# Patient Record
Sex: Female | Born: 1985 | Race: White | Hispanic: No | Marital: Married | Smoking: Former smoker
Health system: Southern US, Community
[De-identification: ages and names within clinical notes are randomized; demographics above are authoritative.]

## PROBLEM LIST (undated history)

## (undated) DIAGNOSIS — M26629 Arthralgia of temporomandibular joint, unspecified side: Secondary | ICD-10-CM

## (undated) DIAGNOSIS — F419 Anxiety disorder, unspecified: Secondary | ICD-10-CM

## (undated) DIAGNOSIS — J189 Pneumonia, unspecified organism: Secondary | ICD-10-CM

## (undated) DIAGNOSIS — J45909 Unspecified asthma, uncomplicated: Secondary | ICD-10-CM

## (undated) DIAGNOSIS — M549 Dorsalgia, unspecified: Secondary | ICD-10-CM

## (undated) DIAGNOSIS — Z8742 Personal history of other diseases of the female genital tract: Secondary | ICD-10-CM

## (undated) DIAGNOSIS — G43909 Migraine, unspecified, not intractable, without status migrainosus: Secondary | ICD-10-CM

## (undated) DIAGNOSIS — K219 Gastro-esophageal reflux disease without esophagitis: Secondary | ICD-10-CM

## (undated) HISTORY — PX: COLONOSCOPY: SHX174

## (undated) HISTORY — PX: WISDOM TOOTH EXTRACTION: SHX21

## (undated) HISTORY — DX: Migraine, unspecified, not intractable, without status migrainosus: G43.909

## (undated) HISTORY — DX: Personal history of other diseases of the female genital tract: Z87.42

## (undated) HISTORY — DX: Dorsalgia, unspecified: M54.9

## (undated) HISTORY — DX: Arthralgia of temporomandibular joint, unspecified side: M26.629

## (undated) HISTORY — DX: Anxiety disorder, unspecified: F41.9

---

## 2013-01-16 ENCOUNTER — Encounter: Payer: Self-pay | Admitting: Family

## 2013-01-16 ENCOUNTER — Ambulatory Visit (INDEPENDENT_AMBULATORY_CARE_PROVIDER_SITE_OTHER): Payer: BC Managed Care – PPO | Admitting: Family

## 2013-01-16 VITALS — BP 110/80 | HR 96 | Temp 98.5°F | Resp 16 | Ht 60.5 in | Wt 138.1 lb

## 2013-01-16 DIAGNOSIS — G43909 Migraine, unspecified, not intractable, without status migrainosus: Secondary | ICD-10-CM

## 2013-01-16 DIAGNOSIS — M542 Cervicalgia: Secondary | ICD-10-CM

## 2013-01-16 DIAGNOSIS — F341 Dysthymic disorder: Secondary | ICD-10-CM

## 2013-01-16 DIAGNOSIS — G8929 Other chronic pain: Secondary | ICD-10-CM

## 2013-01-16 DIAGNOSIS — F419 Anxiety disorder, unspecified: Secondary | ICD-10-CM

## 2013-01-16 DIAGNOSIS — F411 Generalized anxiety disorder: Secondary | ICD-10-CM

## 2013-01-16 DIAGNOSIS — M549 Dorsalgia, unspecified: Secondary | ICD-10-CM

## 2013-01-16 DIAGNOSIS — Z309 Encounter for contraceptive management, unspecified: Secondary | ICD-10-CM

## 2013-01-16 NOTE — Patient Instructions (Addendum)
Please complete your x rays on the first floor. You will be contacted about your referral to the psychiatrist and the therapist. Welcome to [facility]!
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
## 2013-01-18 ENCOUNTER — Encounter: Payer: Self-pay | Admitting: Family

## 2013-01-18 ENCOUNTER — Ambulatory Visit (HOSPITAL_BASED_OUTPATIENT_CLINIC_OR_DEPARTMENT_OTHER)
Admission: RE | Admit: 2013-01-18 | Discharge: 2013-01-18 | Disposition: A | Discharge status: Home / Self Care | Payer: BC Managed Care – PPO | Source: Ambulatory Visit | Attending: Family | Admitting: Family

## 2013-01-18 DIAGNOSIS — M549 Dorsalgia, unspecified: Secondary | ICD-10-CM | POA: Insufficient documentation

## 2013-01-18 DIAGNOSIS — M542 Cervicalgia: Secondary | ICD-10-CM | POA: Insufficient documentation

## 2013-01-18 DIAGNOSIS — G8929 Other chronic pain: Secondary | ICD-10-CM

## 2013-01-19 ENCOUNTER — Encounter: Payer: Self-pay | Admitting: Family

## 2013-01-19 ENCOUNTER — Telehealth: Payer: Self-pay | Admitting: Family

## 2013-01-19 ENCOUNTER — Telehealth: Payer: Self-pay | Admitting: *Deleted

## 2013-01-19 DIAGNOSIS — G894 Chronic pain syndrome: Secondary | ICD-10-CM

## 2013-01-19 NOTE — Telephone Encounter (Signed)
Received medical records from [facility]. Oneida Arenas: [phone] F: [phone]

## 2013-01-19 NOTE — Telephone Encounter (Signed)
Lindsey Petty, could you pls fax referral to Dr. Laury Deep (psychiatry) at [facility]?  Thanks.

## 2013-01-19 NOTE — Telephone Encounter (Signed)
Opened in error

## 2013-01-19 NOTE — Telephone Encounter (Signed)
Message copied by Kathi Simpers on Fri Jan 19, 2013 10:51 AM ------      Message from: O'SULLIVAN, MELISSA      Created: Thu Jan 18, 2013 10:20 PM       Pls call pt and let her know that the x rays of her neck, upper and lower back are normal.  I would recommend PT to help with pain if she is willing. ------

## 2013-01-19 NOTE — Telephone Encounter (Signed)
Notified pt. She states that she is not able to afford physical therapy. Also states that she does not want to see a chiropractor as she has in the past and it only aggravated her pain. Pt wants to know what her next steps will be? Pt states we may notify her via mychart.

## 2013-01-21 DIAGNOSIS — F329 Major depressive disorder, single episode, unspecified: Secondary | ICD-10-CM | POA: Insufficient documentation

## 2013-01-21 DIAGNOSIS — Z309 Encounter for contraceptive management, unspecified: Secondary | ICD-10-CM | POA: Insufficient documentation

## 2013-01-21 DIAGNOSIS — M549 Dorsalgia, unspecified: Secondary | ICD-10-CM | POA: Insufficient documentation

## 2013-01-21 DIAGNOSIS — M542 Cervicalgia: Secondary | ICD-10-CM | POA: Insufficient documentation

## 2013-01-21 DIAGNOSIS — G43909 Migraine, unspecified, not intractable, without status migrainosus: Secondary | ICD-10-CM | POA: Insufficient documentation

## 2013-01-21 NOTE — Assessment & Plan Note (Signed)
Consider referral to another neurologist next visit if pt is agreeable.

## 2013-01-21 NOTE — Assessment & Plan Note (Signed)
During visit pt requested "somthing stronger for chronic pain." I advised her that I do not treat chronic pain and recommended a referral to pain management after we further evaluate etiology of her pain.  X ray of thoracic/lumbar spines unremarkable.  Likely musculoskeletal.  Recommended PT she declines due to cost.  Alternatively recommended referral to pain management or sports medicine.  See my chart message/phone notes.

## 2013-01-21 NOTE — Assessment & Plan Note (Addendum)
Depression appears well controlled on Wellbutrin, however, anxiety is suboptimally controlled.  I am concerned about her "memory loss" during childhood and possibly some underlying PTSD. I have advised her to be evaluated by psychiatry and psychology.

## 2013-01-21 NOTE — Assessment & Plan Note (Signed)
She does not like side effects of depo.  I advised her that we could consider OCP when her depo would be due.

## 2013-01-22 ENCOUNTER — Telehealth: Payer: Self-pay | Admitting: Family

## 2013-01-22 NOTE — Telephone Encounter (Signed)
Message copied by Sandford Craze on Mon Jan 22, 2013  5:07 PM ------      Message from: Luanne Bras      Created: Mon Jan 22, 2013  1:03 PM      Regarding: Your message may not be read      Contact: [phone]          ----- Delivery failure of internet email alert          ----- Failed to log attempted delivery of internet email alert       Tickler type: Message      Message Id(WMG): [id number]      SMTP Response: 410      Patient: Lindsey Petty,Lindsey Petty([id number])      Recipient: ()      Internet alert email: [email or web address]               ----- Original WMG message to the patient -----      Sent: 01/22/2013 12:59 PM      From: Sandford Craze, NP      To: [facility]      Message Type: Patient Medical Advice Request      Subject: RE: Visit Follow-Up Question      I have placed referral.  Myriam Jacobson will contact you when appointment is arranged.            Shakima Nisley            ----- Message -----         FromSharin Grave         Sent: 01/19/2013  8:36 PM EDT           To: Lemont Fillers., NP      Subject: Visit Follow-Up Question            If you could do a referral to the pain management doctor that would probably be the best because I have tried to get back into my workout routine but I can't make it too long without having to stop because it hurts too much. I'll try to set the appointment after the psychiatrist to see if the pain is being caused by the anxiety and/or depression. ------

## 2013-01-23 ENCOUNTER — Ambulatory Visit (INDEPENDENT_AMBULATORY_CARE_PROVIDER_SITE_OTHER): Payer: BC Managed Care – PPO | Admitting: Psychiatry

## 2013-01-23 ENCOUNTER — Encounter (HOSPITAL_COMMUNITY): Payer: Self-pay | Admitting: Psychiatry

## 2013-01-23 ENCOUNTER — Telehealth: Payer: Self-pay | Admitting: Family

## 2013-01-23 VITALS — BP 118/82 | HR 122 | Ht 60.0 in | Wt 142.0 lb

## 2013-01-23 DIAGNOSIS — F429 Obsessive-compulsive disorder, unspecified: Secondary | ICD-10-CM

## 2013-01-23 DIAGNOSIS — F4310 Post-traumatic stress disorder, unspecified: Secondary | ICD-10-CM

## 2013-01-23 DIAGNOSIS — F411 Generalized anxiety disorder: Secondary | ICD-10-CM

## 2013-01-23 MED ORDER — FLUOXETINE HCL 10 MG PO CAPS: 10.0000 mg | ORAL_CAPSULE | Freq: Every day | ORAL | Status: DC

## 2013-01-23 NOTE — Telephone Encounter (Signed)
Received medical records from [facility] in [place], [place]  [place]: [phone] F: [phone]
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
## 2013-01-28 ENCOUNTER — Encounter: Payer: Self-pay | Admitting: Family

## 2013-01-29 ENCOUNTER — Telehealth (HOSPITAL_COMMUNITY): Payer: Self-pay

## 2013-01-29 NOTE — Telephone Encounter (Signed)
Called patient's husband twice. No answer.  Called patient twice, advised her to have vaginal bleeding evaluated by her PCP or Obgyn.

## 2013-01-29 NOTE — Telephone Encounter (Signed)
Lindsey Petty, pt is requesting appointment status with Pain management due to worsening pain.

## 2013-01-29 NOTE — Telephone Encounter (Signed)
Called patient - Left message

## 2013-01-30 ENCOUNTER — Telehealth (HOSPITAL_COMMUNITY): Payer: Self-pay

## 2013-01-30 NOTE — Telephone Encounter (Addendum)
PT RETURNED CALL 2 TIMES LAST NIGHT  LM WITH PATIENT @8 :30 AM 01/31/13 TO COME TO APPT AT 11AM TODAY.

## 2013-01-30 NOTE — Telephone Encounter (Addendum)
The patient reports that she had some extra bleeding and she talked to her gynecologist and she has been in better mood today and has not been taking prozac.  She has gone down to clonazepam but does not feel any withdrawal symptoms.  Will reschedule patient. If possible early tomorrow.

## 2013-01-31 ENCOUNTER — Encounter (HOSPITAL_COMMUNITY): Payer: Self-pay | Admitting: Psychiatry

## 2013-01-31 ENCOUNTER — Ambulatory Visit (INDEPENDENT_AMBULATORY_CARE_PROVIDER_SITE_OTHER): Payer: BC Managed Care – PPO | Admitting: Psychiatry

## 2013-01-31 ENCOUNTER — Encounter: Payer: Self-pay | Admitting: Family

## 2013-01-31 VITALS — BP 108/70 | HR 108 | Ht 60.0 in | Wt 138.0 lb

## 2013-01-31 DIAGNOSIS — F411 Generalized anxiety disorder: Secondary | ICD-10-CM

## 2013-01-31 MED ORDER — BUPROPION HCL ER (XL) 150 MG PO TB24: ORAL_TABLET | ORAL | Status: DC
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
## 2013-02-05 ENCOUNTER — Telehealth (HOSPITAL_COMMUNITY): Payer: Self-pay

## 2013-02-05 ENCOUNTER — Telehealth: Payer: Self-pay | Admitting: Family

## 2013-02-05 NOTE — Telephone Encounter (Signed)
WANTS TO KNOW WHAT TESTS SHE IS BEING REFERRED FOR [facility] AND SAYS DOING WELL ON MEDICATION

## 2013-02-05 NOTE — Telephone Encounter (Signed)
Lindsey Petty, could you please submit her pain clinic referral to a different pain clinic? Dr. Emelda Fear office declined to take her. thanks

## 2013-02-06 NOTE — Telephone Encounter (Signed)
Referral to [facility]

## 2013-02-06 NOTE — Telephone Encounter (Signed)
Called patient and informed her about test.  She wants [facility].

## 2013-02-28 ENCOUNTER — Encounter: Payer: Self-pay | Admitting: Family

## 2013-03-01 ENCOUNTER — Ambulatory Visit (HOSPITAL_COMMUNITY): Payer: Self-pay | Admitting: Psychiatry

## 2013-03-09 ENCOUNTER — Telehealth: Payer: Self-pay | Admitting: Family

## 2013-03-09 NOTE — Telephone Encounter (Signed)
Notified pt of Provider instructions. Pt states that she is finding it difficult to afford follow up with Psychiatry as they are still running tests. Advised pt that she needs to contact psychiatry for refills. Pt states that she probably will not go back to them and disconnected the call.

## 2013-03-09 NOTE — Telephone Encounter (Signed)
These medications should be filled by psychiatry- Dr. Jacqulyn Cane, MD

## 2013-03-09 NOTE — Telephone Encounter (Signed)
Please advise 

## 2013-03-09 NOTE — Telephone Encounter (Signed)
Patient called in requesting refills of wellbutrin and klonopin

## 2013-03-12 ENCOUNTER — Encounter: Payer: Self-pay | Admitting: Family

## 2013-03-13 ENCOUNTER — Encounter: Payer: Self-pay | Admitting: Family

## 2013-03-13 MED ORDER — ALPRAZOLAM 0.5 MG PO TABS: ORAL_TABLET | ORAL | Status: DC

## 2013-03-15 ENCOUNTER — Telehealth: Payer: Self-pay | Admitting: *Deleted

## 2013-03-15 NOTE — Telephone Encounter (Signed)
Received message from pt on 03/14/13. Message was very poor quality and I could not understand pt's request.  Left message on cell# to return my call and clarify request.

## 2013-03-17 ENCOUNTER — Encounter: Payer: Self-pay | Admitting: Family

## 2013-03-19 ENCOUNTER — Other Ambulatory Visit (HOSPITAL_COMMUNITY): Payer: Self-pay | Admitting: Psychiatry

## 2013-04-16 ENCOUNTER — Ambulatory Visit (HOSPITAL_COMMUNITY): Payer: Self-pay | Admitting: Psychiatry

## 2013-04-27 ENCOUNTER — Encounter: Payer: Self-pay | Admitting: Family Medicine

## 2013-04-27 ENCOUNTER — Ambulatory Visit (INDEPENDENT_AMBULATORY_CARE_PROVIDER_SITE_OTHER): Payer: BC Managed Care – PPO | Admitting: Family Medicine

## 2013-04-27 VITALS — BP 131/81 | HR 116 | Ht 60.5 in | Wt 143.0 lb

## 2013-04-27 DIAGNOSIS — F909 Attention-deficit hyperactivity disorder, unspecified type: Secondary | ICD-10-CM

## 2013-04-27 DIAGNOSIS — M2669 Other specified disorders of temporomandibular joint: Secondary | ICD-10-CM

## 2013-04-27 DIAGNOSIS — H9319 Tinnitus, unspecified ear: Secondary | ICD-10-CM

## 2013-04-27 DIAGNOSIS — F411 Generalized anxiety disorder: Secondary | ICD-10-CM

## 2013-04-27 DIAGNOSIS — H9313 Tinnitus, bilateral: Secondary | ICD-10-CM

## 2013-04-27 DIAGNOSIS — R413 Other amnesia: Secondary | ICD-10-CM

## 2013-04-27 MED ORDER — CLONAZEPAM 1 MG PO TABS: 1.0000 mg | ORAL_TABLET | Freq: Three times a day (TID) | ORAL | Status: DC | PRN

## 2013-04-27 MED ORDER — CYCLOBENZAPRINE HCL 10 MG PO TABS: 10.0000 mg | ORAL_TABLET | Freq: Two times a day (BID) | ORAL | Status: AC | PRN
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
## 2013-04-30 ENCOUNTER — Telehealth: Payer: Self-pay | Admitting: *Deleted

## 2013-04-30 DIAGNOSIS — F909 Attention-deficit hyperactivity disorder, unspecified type: Secondary | ICD-10-CM

## 2013-04-30 NOTE — Telephone Encounter (Signed)
Pt states the strattera is causing nausea and makes her feel "cloudy headed." Please advise

## 2013-04-30 NOTE — Telephone Encounter (Signed)
Pt notfied. Pt states she cannot take the medication for a week because she cannot function at work. She said she will stop taking it unless you write her out of work but she cannot afford to be out of work. Pt would like another alternative.Please advise

## 2013-04-30 NOTE — Telephone Encounter (Signed)
These are common initial side effects, I'd encourage continued daily dose but please let me know if it lasts beyond one week then we can go over alternatives.

## 2013-05-01 ENCOUNTER — Telehealth: Payer: Self-pay | Admitting: *Deleted

## 2013-05-01 MED ORDER — LISDEXAMFETAMINE DIMESYLATE 20 MG PO CAPS: 20.0000 mg | ORAL_CAPSULE | ORAL | Status: DC

## 2013-05-01 NOTE — Telephone Encounter (Signed)
Agreed -

## 2013-05-01 NOTE — Telephone Encounter (Signed)
All other medication options for ADD will have similar warnings about being taken with anti depression medications.  We have many patients taking anti depression medications along with medications like vyvanse without negative results.  I feel it's safe to try Vyvanse in her situation, unfortunately medications like Vyvanse are the last treatment option for ADD.  (Again, my recommendation would be to try Vyvanse.)

## 2013-05-01 NOTE — Telephone Encounter (Signed)
Vyvanse would be my next recommendation if she is intolerant of stratera. Rx and savings card placed in the inbox.

## 2013-05-01 NOTE — Telephone Encounter (Signed)
Pt called again wanting to know what she needs to do about the "medication issue." I reiterated what was stated before in the previous phone note and told her that she may need an appt to discuss concerns. Pt states she doesn't have the money to come in and wants to know if we could waive the office fee. I told her no. She would be responsible for whatever copay her insurance dictates at the time of her visit. Pt states she will try the vyvanse but wanted to know if she could have a note for work because they sent her home yesterday because she was " acting out of it." She would like the note to let her employer know that she is on medication that may cause a changes in her behavior and that she may experience this change until she gets on the right medication. Pt wanted me to email this to her. I told her I could mail it to her. Pt agreed

## 2013-05-01 NOTE — Telephone Encounter (Signed)
Pt called back and states she read that Vyvanse should not be taken with other antidepressants...the patient states she wants to try something else........Marland Kitchen

## 2013-05-01 NOTE — Telephone Encounter (Signed)
I notified pt word for word Dr. Genelle Bal reccomendations below.Pt now wanted to know why Vyvanse was the last tx option. I explained to her that Dr. Ivan Anchors means that medications LIKE vyvanse are the last tx options. Pt mentioned Ritalin and why couldn't that be an option. I told her that is basically the same type of medication. They work in the same way. Pt states she does not want to keep trying meds and spending money on medications for it to not work. I told her that I do understand the financial aspect but the only way to see if a medication is right for you is to try it first. I told the pt if at this point if  she still has other concerns and questions she needed to come in and discuss her concerns in an office visit.

## 2013-05-01 NOTE — Telephone Encounter (Signed)
Pt notified and rx left up front along with savings card

## 2013-05-02 ENCOUNTER — Encounter: Payer: Self-pay | Admitting: Family Medicine

## 2013-05-02 ENCOUNTER — Ambulatory Visit (INDEPENDENT_AMBULATORY_CARE_PROVIDER_SITE_OTHER): Payer: BC Managed Care – PPO | Admitting: Family Medicine

## 2013-05-02 ENCOUNTER — Telehealth: Payer: Self-pay | Admitting: *Deleted

## 2013-05-02 VITALS — BP 127/86 | HR 96 | Wt 140.0 lb

## 2013-05-02 DIAGNOSIS — F909 Attention-deficit hyperactivity disorder, unspecified type: Secondary | ICD-10-CM

## 2013-05-02 DIAGNOSIS — R110 Nausea: Secondary | ICD-10-CM

## 2013-05-02 DIAGNOSIS — R51 Headache: Secondary | ICD-10-CM

## 2013-05-02 MED ORDER — KETOROLAC TROMETHAMINE 60 MG/2ML IM SOLN
60.0000 mg | Freq: Once | INTRAMUSCULAR | Status: AC
  Administered 2013-05-02: 60 mg via INTRAMUSCULAR

## 2013-05-02 MED ORDER — HYDROCODONE-ACETAMINOPHEN 5-325 MG PO TABS: 1.0000 | ORAL_TABLET | Freq: Four times a day (QID) | ORAL | Status: DC | PRN

## 2013-05-02 MED ORDER — METHYLPHENIDATE HCL 5 MG PO TABS: 5.0000 mg | ORAL_TABLET | Freq: Two times a day (BID) | ORAL | Status: DC

## 2013-05-02 MED ORDER — ONDANSETRON HCL 4 MG PO TABS: ORAL_TABLET | ORAL | Status: DC

## 2013-05-02 NOTE — Progress Notes (Signed)
CC: Lindsey Petty is a 27 y.o. female is here for discuss ADHD medication   Subjective: HPI:  Patient presents complaining of headache fatigue nausea and anxiety all which started approximately 24 hours after starting Strattera. She took the medication for 3 days and stopped the day before yesterday. Symptoms have been persistent ever since onset moderate to severe in severity. She's been unable to work due to the symptoms. Anxiety is worse during confrontations with customers, nothing particularly makes headache fatigue better or worse. Nausea is worse around strong odor or so. She reports eating only one meal since the beginning of the week. She denies abdominal pain or vomiting. She does believe she's had some diarrhea. There been no changes to medications other than starting Strattera. Symptoms have been persistent since onset came on abruptly present all hours of the day. She denies fevers, chills, motor or sensory disturbances, hallucinations, chest pain, shortness of breath, rashes, joint pain, blood in stool.   Review Of Systems Outlined In HPI  Past Medical History  Diagnosis Date  . Anxiety   . History of ovarian cyst   . Migraine   . Back pain   . TMJ syndrome      Family History  Problem Relation Age of Onset  . Diabetes Mother   . Depression Mother   . Alcohol abuse Mother   . Heart disease Father   . Cancer Father     lung  . Heart disease Paternal Uncle   . Cancer Maternal Grandmother     ? skin cancer  . Diabetes Maternal Grandmother   . Heart disease Paternal Uncle      History  Substance Use Topics  . Smoking status: Former Games developer  . Smokeless tobacco: Never Used  . Alcohol Use: 1.5 - 2 oz/week    3-4 drink(s) per week     Objective: Filed Vitals:   05/02/13 1430  BP: 127/86  Pulse: 96    General: Alert and Oriented, No Acute Distress HEENT: Pupils equal, round, reactive to light. Conjunctivae clear.  Moist mucous membranes pharynx  unremarkable Lungs: Clear and comfortable work of breathing Cardiac: Regular rate and rhythm.  Abdomen: Soft nontender Extremities: No peripheral edema.  Strong peripheral pulses.  Mental Status: No depression,nor agitation. Mild anxiety. Normal thought process Skin: Warm and dry.  Assessment & Plan: Lindsey Petty was seen today for discuss adhd medication.  Diagnoses and associated orders for this visit:  ADHD (attention deficit hyperactivity disorder) - methylphenidate (RITALIN) 5 MG tablet; Take 1 tablet (5 mg total) by mouth 2 (two) times daily.  Nausea alone - ondansetron (ZOFRAN) 4 MG tablet; One to two by mouth every 8 hours only if needed for nausea.  Headache(784.0) - HYDROcodone-acetaminophen (NORCO/VICODIN) 5-325 MG per tablet; Take 1 tablet by mouth every 6 (six) hours as needed for pain. - ketorolac (TORADOL) injection 60 mg; Inject 2 mLs (60 mg total) into the muscle once.    ADHD: Uncontrolled, suspect her nausea and headache along with her other constellation of symptoms is due to norepinephrine surge from being on Wellbutrin and Strattera. Symptomatically control headache with Toradol today she is requesting something stronger than Toradol in case headache persists at home small dose of Vicodin provided as I suspect symptoms should subside after 5 days of her last dose of Strattera.. Zofran as needed for nausea received one dose here 8 mg. When she is feeling back to her normal state of health can try Ritalin.  25 minutes spent face-to-face during visit  today of which at least 50% was counseling or coordinating care regarding ADHD, headache, nausea.   Return in about 4 weeks (around 05/30/2013).

## 2013-05-02 NOTE — Telephone Encounter (Signed)
I would expect no longer than five days.

## 2013-05-02 NOTE — Telephone Encounter (Signed)
Pt states she is going on day three of not taking the strattera and she still feels nauseous and has a HA. Wants to know how long it will take for medication to get out of her system

## 2013-05-02 NOTE — Telephone Encounter (Signed)
Left message on pt.'s vm.

## 2013-05-03 ENCOUNTER — Telehealth: Payer: Self-pay | Admitting: *Deleted

## 2013-05-03 DIAGNOSIS — R51 Headache: Secondary | ICD-10-CM

## 2013-05-03 MED ORDER — PROMETHAZINE HCL 25 MG PO TABS: 25.0000 mg | ORAL_TABLET | Freq: Four times a day (QID) | ORAL | Status: DC | PRN

## 2013-05-03 NOTE — Telephone Encounter (Signed)
Pt of Dr. Ivan Anchors asking if we can give her something different for the nausea because the Zofran is not helping and she states she still has the HA and wants to know if we can give her some more pain medication.  Please advise.  Meyer Cory, LPN

## 2013-05-03 NOTE — Telephone Encounter (Signed)
Pt notified of phenergan sent to pharmacy and I would forward this to Dr. Ivan Anchors in regards to pain and he will address when returns in the morning. Barry Dienes, LPN

## 2013-05-03 NOTE — Telephone Encounter (Signed)
Okay, will send over a prescription for Phenergan instead. As far as pain medication she can wait until Dr. Ivan Anchors is back tomorrow to address.

## 2013-05-04 MED ORDER — HYDROCODONE-ACETAMINOPHEN 5-325 MG PO TABS: 1.0000 | ORAL_TABLET | Freq: Three times a day (TID) | ORAL | Status: DC | PRN

## 2013-05-04 NOTE — Telephone Encounter (Signed)
Andrea, Rx placed in in-box ready for pickup/faxing.  

## 2013-05-04 NOTE — Telephone Encounter (Signed)
Pt has been notified.

## 2013-05-07 ENCOUNTER — Encounter: Payer: Self-pay | Admitting: Family Medicine

## 2013-05-07 ENCOUNTER — Ambulatory Visit (INDEPENDENT_AMBULATORY_CARE_PROVIDER_SITE_OTHER): Payer: BC Managed Care – PPO | Admitting: Family Medicine

## 2013-05-07 ENCOUNTER — Telehealth: Payer: Self-pay | Admitting: *Deleted

## 2013-05-07 VITALS — BP 126/86 | HR 80 | Wt 144.0 lb

## 2013-05-07 DIAGNOSIS — H538 Other visual disturbances: Secondary | ICD-10-CM

## 2013-05-07 DIAGNOSIS — R5383 Other fatigue: Secondary | ICD-10-CM

## 2013-05-07 DIAGNOSIS — G4700 Insomnia, unspecified: Secondary | ICD-10-CM

## 2013-05-07 DIAGNOSIS — R5381 Other malaise: Secondary | ICD-10-CM

## 2013-05-07 MED ORDER — TRAZODONE HCL 50 MG PO TABS: 50.0000 mg | ORAL_TABLET | Freq: Every day | ORAL | Status: DC

## 2013-05-07 NOTE — Progress Notes (Signed)
CC: Lindsey Petty is a 27 y.o. female is here for Headache and Hallucinations   Subjective: HPI:  Patient complains of persistent headache ever since starting and then stopping Strattera one week ago. Localized to the crown of the head constant slightly improved with hydrocodone nothing else makes better or worse. Has not changed in character or severity since onset moderate in severity. Has not been accompanied by motor or sensory disturbances other than below. She denies worst headache of her life, waking from headaches, fevers nor rash. Denies nasal congestion, cough, facial pressure.  Patient complains of trouble sleeping for the past week ever since stopping Strattera. She will fall asleep for one-2 hours only to wake and not able to get back to sleep. Fianc describes night terrors while sleeping. Severity moderate in severity. Nothing seems to make better or worse other than that started all after stopping Strattera. She's taking Ritalin twice a day last dose 7-8 prior to bedtime. She endorses worsening depression and irritability since sleep disturbance began. She reports auditory hallucinations described as hearing a dog bark or believes that she and her fianc had random discussions but did not actually occur. Since insomnia occurred she has had blurred vision when looking at objects closely denies any other vision disturbance. Has been fatigued since insomnia occurred, all these symptoms were present prior to starting Ritalin. She has had spotting and irregular bleeding since starting Depo-Medrol  Review Of Systems Outlined In HPI  Past Medical History  Diagnosis Date  . Anxiety   . History of ovarian cyst   . Migraine   . Back pain   . TMJ syndrome      Family History  Problem Relation Age of Onset  . Diabetes Mother   . Depression Mother   . Alcohol abuse Mother   . Heart disease Father   . Cancer Father     lung  . Heart disease Paternal Uncle   . Cancer Maternal  Grandmother     ? skin cancer  . Diabetes Maternal Grandmother   . Heart disease Paternal Uncle      History  Substance Use Topics  . Smoking status: Former Games developer  . Smokeless tobacco: Never Used  . Alcohol Use: 1.5 - 2 oz/week    3-4 drink(s) per week     Objective: Filed Vitals:   05/07/13 1059  BP: 126/86  Pulse: 80    Vital signs reviewed. General: Alert and Oriented, No Acute Distress HEENT: Pupils equal, round, reactive to light. Conjunctivae clear.  External ears unremarkable.  Moist mucous membranes. Lungs: Clear and comfortable work of breathing, speaking in full sentences without accessory muscle use. Cardiac: Regular rate and rhythm.  Neuro: CN II-XII grossly intact, gait normal. Extremities: No peripheral edema.  Strong peripheral pulses.  Mental Status: No depression, anxiety, nor agitation. Logical though process. Skin: Warm and dry.  Assessment & Plan: Lindsey Petty was seen today for headache and hallucinations.  Diagnoses and associated orders for this visit:  Fatigue - CBC  Blurred vision - COMPLETE METABOLIC PANEL WITH GFR  Insomnia - traZODone (DESYREL) 50 MG tablet; Take 1 tablet (50 mg total) by mouth at bedtime.    Fatigue: CBC to rule out anemia Blurred vision: Rule out hyperglycemia Insomnia: Suspected lack of sleep could be contributing to confusion and auditory hallucinations given worsening depression discussed that trazodone possibly help the 2 issues.  Return if symptoms worsen or fail to improve.

## 2013-05-08 ENCOUNTER — Telehealth: Payer: Self-pay | Admitting: Family Medicine

## 2013-05-08 DIAGNOSIS — R51 Headache: Secondary | ICD-10-CM

## 2013-05-08 LAB — COMPLETE METABOLIC PANEL WITH GFR
AST: 21 U/L (ref 0–37)
BUN: 10 mg/dL (ref 6–23)
Calcium: 10.6 mg/dL — ABNORMAL HIGH (ref 8.4–10.5)
Chloride: 104 mEq/L (ref 96–112)
Creat: 0.86 mg/dL (ref 0.50–1.10)
GFR, Est Non African American: >89 mL/min
Glucose, Bld: 89 mg/dL (ref 70–99)

## 2013-05-08 LAB — CBC
MCV: 89.2 fL (ref 78.0–100.0)
Platelets: 342 10*3/uL (ref 150–400)
RBC: 4.53 MIL/uL (ref 3.87–5.11)
WBC: 7.1 10*3/uL (ref 4.0–10.5)

## 2013-05-08 MED ORDER — TRAMADOL HCL 50 MG PO TABS: 50.0000 mg | ORAL_TABLET | Freq: Three times a day (TID) | ORAL | Status: DC | PRN

## 2013-05-08 NOTE — Telephone Encounter (Signed)
Pt would like more pain medication °

## 2013-05-08 NOTE — Telephone Encounter (Signed)
I'd recommend now trying tramadol to avoid rebound headaches from hydrocodone.  Rx printed in your inbox.  Also elevated calcium can cause headaches, psychological disturbance and nausea since this was elvated yesterday I'd like repeat calcium and parathyoid hormone which i've also printed off lab slips for.

## 2013-05-08 NOTE — Telephone Encounter (Signed)
Lindsey Petty, Can you ask the lab if they can add on a intact PTH order on [name] labs from yesterday, order placed in your inbox, can you also let [name] know that her labs were normal other than an elevated calcium which I've asked the labs to run further tests to look for the etiology.

## 2013-05-08 NOTE — Telephone Encounter (Signed)
Called [facility] and they cannot add this lab on because it has to be frozen.

## 2013-05-08 NOTE — Telephone Encounter (Signed)
Pt notified. Pt aware to have labs drawn. Faxed order to lab

## 2013-05-10 LAB — PTH, INTACT AND CALCIUM
Calcium: 9.3 mg/dL (ref 8.4–10.5)
PTH: 50.4 pg/mL (ref 14.0–72.0)

## 2013-05-14 ENCOUNTER — Ambulatory Visit (INDEPENDENT_AMBULATORY_CARE_PROVIDER_SITE_OTHER): Payer: BC Managed Care – PPO | Admitting: Family Medicine

## 2013-05-14 ENCOUNTER — Encounter: Payer: Self-pay | Admitting: Family Medicine

## 2013-05-14 VITALS — BP 118/73 | HR 107 | Wt 139.0 lb

## 2013-05-14 DIAGNOSIS — G4700 Insomnia, unspecified: Secondary | ICD-10-CM

## 2013-05-14 DIAGNOSIS — R51 Headache: Secondary | ICD-10-CM

## 2013-05-14 DIAGNOSIS — F909 Attention-deficit hyperactivity disorder, unspecified type: Secondary | ICD-10-CM

## 2013-05-14 MED ORDER — HYDROCODONE-ACETAMINOPHEN 5-325 MG PO TABS: 1.0000 | ORAL_TABLET | Freq: Three times a day (TID) | ORAL | Status: DC | PRN

## 2013-05-14 MED ORDER — METHYLPHENIDATE HCL 10 MG PO TABS: 10.0000 mg | ORAL_TABLET | Freq: Two times a day (BID) | ORAL | Status: DC

## 2013-05-14 MED ORDER — TEMAZEPAM 15 MG PO CAPS: 15.0000 mg | ORAL_CAPSULE | Freq: Every evening | ORAL | Status: DC | PRN

## 2013-05-14 NOTE — Progress Notes (Signed)
CC: Lindsey Petty is a 27 y.o. female is here for discuss HA and fatigue   Subjective: HPI:  Patient with continued headache is present on a daily basis it is localized to the crown of her head most days of the week he will also have a pounding component on the right temple that is accompanied by nausea otherwise it as a dull moderate heaviness that feels like a band tightening around her head. The only thing that has helped has been hydrocodone, tramadol and ibuprofen have not been beneficial. Symptoms at the present ever since one day after starting Strattera approximately 3 weeks ago.  She denies motor or sensory disturbances, facial pressure, runny nose, ear pressure, hearing loss, dizziness. She awakens because of the headache frequently, the headache is worse with exertion, she would not define this as the worst headache of her life. She denies fevers, chills, chest pain, shortness of breath  Patient complains of moderate to severe fatigue that has been present ever since taking a single dose of Strattera 3 weeks ago. She describes this as lack of energy and motivation is present on a daily basis 24 hours a day. She's had to miss work due to fatigue she has been missing out in all hobbies and pleasurable activities because she doesn't have energy to participate. When she falls asleep she is only able to stay asleep for 2-3 hours and will stay awake for hours for no particular reason. She does not feel well rested after sleeping. Sleep is only mildly improved with trazodone. She's tried Ambien in the remote past which caused intolerable nightmares.  On a positive note she feels the Ritalin is helping with concentration and task completion at work but only for one to 2 hours after taking a dose. She denies sleep disturbance with this medication citing that she's able to fall asleep 1-2 hours after taking the medication. She denies worsened anxiety depression or appetite suppression.   Review Of  Systems Outlined In HPI  Past Medical History  Diagnosis Date  . Anxiety   . History of ovarian cyst   . Migraine   . Back pain   . TMJ syndrome      Family History  Problem Relation Age of Onset  . Diabetes Mother   . Depression Mother   . Alcohol abuse Mother   . Heart disease Father   . Cancer Father     lung  . Heart disease Paternal Uncle   . Cancer Maternal Grandmother     ? skin cancer  . Diabetes Maternal Grandmother   . Heart disease Paternal Uncle      History  Substance Use Topics  . Smoking status: Former Games developer  . Smokeless tobacco: Never Used  . Alcohol Use: 1.5 - 2 oz/week    3-4 drink(s) per week     Objective: Filed Vitals:   05/14/13 1001  BP: 118/73  Pulse: 107    General: Alert and Oriented, No Acute Distress HEENT: Pupils equal, round, reactive to light. Conjunctivae clear.  External ears unremarkable, canals clear with intact TMs with appropriate landmarks.  Middle ear appears open without effusion. Pink inferior turbinates.  Moist mucous membranes, pharynx without inflammation nor lesions.  Neck supple without palpable lymphadenopathy nor abnormal masses. Neuro: CN II-XII grossly intact, full strength/rom of all four extremities, C5/L4/S1 DTRs 2/4 bilaterally, gait normal, rapid alternating movements normal, heel-shin test normal, Rhomberg normal. Lungs: Clear comfortable work of breathing Cardiac: Regular rate and rhythm.  Extremities: No  peripheral edema.  Strong peripheral pulses.  Mental Status: No depression, anxiety, nor agitation. Skin: Warm and dry.  Assessment & Plan: Lindsey Petty was seen today for discuss ha and fatigue.  Diagnoses and associated orders for this visit:  Headache(784.0) - Ambulatory referral to Neurology - HYDROcodone-acetaminophen (NORCO/VICODIN) 5-325 MG per tablet; Take 1 tablet by mouth every 8 (eight) hours as needed for pain.  Insomnia - temazepam (RESTORIL) 15 MG capsule; Take 1 capsule (15 mg total) by  mouth at bedtime as needed for sleep.  ADHD (attention deficit hyperactivity disorder) - methylphenidate (RITALIN) 10 MG tablet; Take 1 tablet (10 mg total) by mouth 2 (two) times daily.    Headache: Given exertional component and duration of headache I encouraged her to have neuroimaging to rule out vascular abnormality, patient declines citing financial issues. We will refer to neurology for further evaluation and workup, refill hydrocodone discussed that this would not be used for long-term management of headache, she will establish with ophthalmologist sometime this week or next week to see if headache is coming from eye strain. Insomnia: Uncontrolled start Restoril stop trazodone ADHD: Improved but still uncontrolled increase Ritalin  Return in about 4 weeks (around 06/11/2013).

## 2013-05-21 ENCOUNTER — Encounter: Payer: Self-pay | Admitting: Family Medicine

## 2013-05-21 ENCOUNTER — Telehealth: Payer: Self-pay | Admitting: *Deleted

## 2013-05-21 DIAGNOSIS — F329 Major depressive disorder, single episode, unspecified: Secondary | ICD-10-CM

## 2013-05-21 DIAGNOSIS — F411 Generalized anxiety disorder: Secondary | ICD-10-CM

## 2013-05-21 NOTE — Telephone Encounter (Signed)
Pt called with an update with how she is feeling. She states her headaches are getting better but she still has no improvement with her depression.

## 2013-05-22 MED ORDER — ARIPIPRAZOLE 5 MG PO TABS: 5.0000 mg | ORAL_TABLET | Freq: Every day | ORAL | Status: DC

## 2013-05-22 NOTE — Telephone Encounter (Signed)
My recommendation stands that she needs to see psych for her mix off anxiety/depression/ADD/insomnia, Marcene's needs have now reached beyond my level of expertise.  In my medical opinion she badly needs to establish with a psychiatrist. No xanax.

## 2013-05-22 NOTE — Telephone Encounter (Signed)
Pt notified; Pt states her insurance copay will be too expensive to see a psychiatrist and she did not want to do that because of the cost.Pt then states that she is having trouble sleeping and wants to know if you would rx Xanax. I advised her that xanax is a medication that is to be used on an prn basis and its a short acting med. I told her that the trazodone is for her sleeping issues. I also reiterated to her that perhaps by adding abilify and getting depression under control then she may see a difference in her anxiety and thus may be able to sleep better. Pt states the trazodone has not helped with sleeping at all. Pt also states that she has missed a lot of work because of her "issues" and she needs to get her issues under control

## 2013-05-22 NOTE — Telephone Encounter (Signed)
Lindsey Petty, Will you please let Lindsey Petty know that I'm pleased to hear that her headaches are somewhat improved.  I'd have to agree that adding abilify to her welbutrin is a good first step. Sent to her [facility].  I'd recommend that she re-establish with either Dr. Demetrius Charity or a psychiatrist that's better covered by her insurance plan.  Usually there's a toll-free number that's on the back of insurance cards that she can get an idea of local psychiatrists.  Please let me know who's available and I'll place a referral.

## 2013-05-23 NOTE — Telephone Encounter (Signed)
Pt notified word for word below reccomendations. Pt states she cannot afford a $200 copay and she "states its funny none of my sxs developed until after he put me on the medication that I told him not to." I told the pt that when a doctor prescribes a medication he does so based on your  sxs you exibit. One cannot know how a medication will exactly affect you until you try the medication. I told her that sometimes it takes a few tries with several different medications to find the right combination for you. I told her that at this point she needs to see a psychiatrist because she needs someone is specializes in the area of anxiety an depression. Pt asked if Dr. Ivan Anchors would refill her Klonipin and if she should keep her appt Monday to discuss adjusting her Ritalin dose. I told her that he will prob not adjust any medications and she could try to look into a psych within her network on her own or we would refer her to someone. Pt states she would just deal with it on her own and then hung up

## 2013-05-24 MED ORDER — CLONAZEPAM 1 MG PO TABS: 1.0000 mg | ORAL_TABLET | Freq: Three times a day (TID) | ORAL | Status: DC | PRN

## 2013-05-24 NOTE — Telephone Encounter (Signed)
Left complete message on pt's vm and faxed rx to [facility] in [place] (listed below)

## 2013-05-24 NOTE — Telephone Encounter (Signed)
I'd be be happy to provide her with 30 days of any of her psych/ADD/insomnia medications until she can see a psychiatrist, I'll print off a clonazepam Rx since she may be close to being out (in inbox).  I've had patients have success with [facility] in [place] and [place] when finances are an issue.  The [place] number is [phone]

## 2013-05-28 ENCOUNTER — Ambulatory Visit: Payer: Self-pay | Admitting: Family Medicine

## 2013-06-07 ENCOUNTER — Ambulatory Visit (INDEPENDENT_AMBULATORY_CARE_PROVIDER_SITE_OTHER): Payer: BC Managed Care – PPO | Admitting: Psychiatry

## 2013-06-07 ENCOUNTER — Encounter (HOSPITAL_COMMUNITY): Payer: Self-pay | Admitting: Psychiatry

## 2013-06-07 VITALS — BP 108/85 | HR 109 | Ht 60.25 in | Wt 140.0 lb

## 2013-06-07 DIAGNOSIS — F411 Generalized anxiety disorder: Secondary | ICD-10-CM

## 2013-06-07 DIAGNOSIS — F909 Attention-deficit hyperactivity disorder, unspecified type: Secondary | ICD-10-CM

## 2013-06-07 MED ORDER — BUPROPION HCL ER (XL) 150 MG PO TB24: 300.0000 mg | ORAL_TABLET | Freq: Every day | ORAL | Status: DC
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
## 2013-06-08 LAB — DRUGS OF ABUSE SCREEN W/O ALC, ROUTINE URINE
Benzodiazepines.: POSITIVE — AB
Creatinine,U: 77.2 mg/dL
Marijuana Metabolite: NEGATIVE
Methadone: NEGATIVE
Phencyclidine (PCP): NEGATIVE
Propoxyphene: NEGATIVE

## 2013-06-10 ENCOUNTER — Telehealth (HOSPITAL_COMMUNITY): Payer: Self-pay | Admitting: Psychiatry

## 2013-06-10 MED ORDER — BUPROPION HCL ER (XL) 150 MG PO TB24: 150.0000 mg | ORAL_TABLET | Freq: Every day | ORAL | Status: DC

## 2013-06-10 MED ORDER — METHYLPHENIDATE HCL ER (LA) 20 MG PO CP24: 20.0000 mg | ORAL_CAPSULE | Freq: Every day | ORAL | Status: DC

## 2013-06-10 NOTE — Telephone Encounter (Signed)
Will resend prescription for bupropion.

## 2013-06-11 LAB — BENZODIAZEPINES (GC/LC/MS), URINE
Alprazolam metabolite (GC/LC/MS), ur confirm: NEGATIVE ng/mL
Clonazepam metabolite (GC/LC/MS), ur confirm: 696 ng/mL
Diazepam (GC/LC/MS), ur confirm: NEGATIVE ng/mL
Flunitrazepam metabolite (GC/LC/MS), ur confirm: NEGATIVE ng/mL
Flurazepam metabolite (GC/LC/MS), ur confirm: NEGATIVE ng/mL
Lorazepam (GC/LC/MS), ur confirm: NEGATIVE ng/mL
Nordiazepam (GC/LC/MS), ur confirm: NEGATIVE ng/mL
Triazolam metabolite (GC/LC/MS), ur confirm: NEGATIVE ng/mL

## 2013-06-14 ENCOUNTER — Ambulatory Visit (HOSPITAL_COMMUNITY): Payer: Self-pay | Admitting: Psychiatry

## 2013-06-18 ENCOUNTER — Telehealth (HOSPITAL_COMMUNITY): Payer: Self-pay

## 2013-06-18 NOTE — Telephone Encounter (Signed)
PT LEFT MESSAGE STATING THAT HER MEDICATION FOR SLEEP IS NOT HELPING AND THEN IS MAKING HER GROGGY THE NEXT DAY WOULD LIKE SOMETHING ELSE

## 2013-06-18 NOTE — Telephone Encounter (Signed)
Trazodone makes her groggy.  Doesn't help her.  Remeron didn't help. Patient will try Benadryl 50 mg at bedtime. She will update next week.

## 2013-06-28 ENCOUNTER — Telehealth (HOSPITAL_COMMUNITY): Payer: Self-pay

## 2013-06-28 ENCOUNTER — Ambulatory Visit (HOSPITAL_COMMUNITY): Payer: Self-pay | Admitting: Psychiatry

## 2013-06-28 ENCOUNTER — Other Ambulatory Visit: Payer: Self-pay

## 2013-06-28 NOTE — Telephone Encounter (Signed)
Called patient. She has decreased wellbutrin to 150 mg, She has stopped Abilify without and adverse reaction.  Patient denies any SI/HI/AVH. She feels like she cannot sleep with Ritalin LA. She denies any SI/HI/AVH.   Patient is interested in starting seroquel as a mood stabilizer. Asked her to discuss this at her next visit.

## 2013-06-28 NOTE — Telephone Encounter (Signed)
NEEDS TO TALK TO YOU ABOUT MEDICATION AND POSSIBLE CHANGE

## 2013-07-05 ENCOUNTER — Telehealth (HOSPITAL_COMMUNITY): Payer: Self-pay

## 2013-07-05 DIAGNOSIS — F411 Generalized anxiety disorder: Secondary | ICD-10-CM

## 2013-07-05 MED ORDER — CLONAZEPAM 0.5 MG PO TABS: 0.5000 mg | ORAL_TABLET | ORAL | Status: DC | PRN

## 2013-07-05 NOTE — Telephone Encounter (Signed)
Patient reports she has reduced her clonazepam to PRN use only about once a day. She states that she will have will have an anxiety as she will go to [place].  Will provide a one week supply of Clonazepam 0.5 mg #7 with no refills.  Will not continue this medication after this refill.

## 2013-07-11 ENCOUNTER — Encounter (HOSPITAL_COMMUNITY): Payer: Self-pay | Admitting: Psychiatry

## 2013-07-11 ENCOUNTER — Ambulatory Visit (INDEPENDENT_AMBULATORY_CARE_PROVIDER_SITE_OTHER): Payer: BC Managed Care – PPO | Admitting: Psychiatry

## 2013-07-11 VITALS — BP 121/91 | HR 109 | Ht 60.25 in | Wt 150.0 lb

## 2013-07-11 DIAGNOSIS — F411 Generalized anxiety disorder: Secondary | ICD-10-CM

## 2013-07-11 DIAGNOSIS — F900 Attention-deficit hyperactivity disorder, predominantly inattentive type: Secondary | ICD-10-CM

## 2013-07-11 DIAGNOSIS — F909 Attention-deficit hyperactivity disorder, unspecified type: Secondary | ICD-10-CM

## 2013-07-11 MED ORDER — VENLAFAXINE HCL ER 37.5 MG PO CP24: 37.5000 mg | ORAL_CAPSULE | Freq: Every day | ORAL | Status: DC

## 2013-07-11 MED ORDER — BUPROPION HCL ER (XL) 150 MG PO TB24: 150.0000 mg | ORAL_TABLET | Freq: Every day | ORAL | Status: DC

## 2013-07-11 MED ORDER — METHYLPHENIDATE HCL ER (OSM) 18 MG PO TBCR: 18.0000 mg | EXTENDED_RELEASE_TABLET | Freq: Every day | ORAL | Status: DC
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
## 2013-07-24 ENCOUNTER — Ambulatory Visit (HOSPITAL_COMMUNITY): Payer: Self-pay | Admitting: Psychiatry

## 2013-07-27 ENCOUNTER — Ambulatory Visit (HOSPITAL_COMMUNITY): Payer: Self-pay | Admitting: Psychiatry

## 2013-08-10 ENCOUNTER — Ambulatory Visit (INDEPENDENT_AMBULATORY_CARE_PROVIDER_SITE_OTHER): Payer: BC Managed Care – PPO | Admitting: Psychiatry

## 2013-08-10 ENCOUNTER — Encounter (HOSPITAL_COMMUNITY): Payer: Self-pay | Admitting: Psychiatry

## 2013-08-10 VITALS — BP 127/63 | HR 90 | Ht 60.25 in | Wt 145.0 lb

## 2013-08-10 DIAGNOSIS — F900 Attention-deficit hyperactivity disorder, predominantly inattentive type: Secondary | ICD-10-CM

## 2013-08-10 DIAGNOSIS — F909 Attention-deficit hyperactivity disorder, unspecified type: Secondary | ICD-10-CM

## 2013-08-10 DIAGNOSIS — F411 Generalized anxiety disorder: Secondary | ICD-10-CM

## 2013-08-10 MED ORDER — BUPROPION HCL ER (XL) 150 MG PO TB24: 150.0000 mg | ORAL_TABLET | Freq: Every day | ORAL | Status: DC

## 2013-08-10 MED ORDER — METHYLPHENIDATE HCL ER (OSM) 18 MG PO TBCR: 18.0000 mg | EXTENDED_RELEASE_TABLET | Freq: Every day | ORAL | Status: DC

## 2013-08-10 MED ORDER — VENLAFAXINE HCL ER 75 MG PO CP24: 75.0000 mg | ORAL_CAPSULE | Freq: Every day | ORAL | Status: DC
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
## 2013-10-11 ENCOUNTER — Ambulatory Visit (HOSPITAL_COMMUNITY): Payer: Self-pay | Admitting: Psychiatry

## 2014-02-25 ENCOUNTER — Inpatient Hospital Stay (HOSPITAL_COMMUNITY): Payer: BC Managed Care – PPO

## 2014-02-25 ENCOUNTER — Encounter (HOSPITAL_COMMUNITY): Payer: Self-pay | Admitting: *Deleted

## 2014-02-25 ENCOUNTER — Inpatient Hospital Stay (HOSPITAL_COMMUNITY)
Admission: AD | Admit: 2014-02-25 | Discharge: 2014-02-25 | Disposition: A | Discharge status: Home / Self Care | Payer: BC Managed Care – PPO | Source: Ambulatory Visit | Attending: Family Medicine | Admitting: Family Medicine

## 2014-02-25 DIAGNOSIS — R102 Pelvic and perineal pain: Secondary | ICD-10-CM

## 2014-02-25 DIAGNOSIS — F411 Generalized anxiety disorder: Secondary | ICD-10-CM | POA: Insufficient documentation

## 2014-02-25 DIAGNOSIS — N898 Other specified noninflammatory disorders of vagina: Secondary | ICD-10-CM | POA: Insufficient documentation

## 2014-02-25 DIAGNOSIS — N949 Unspecified condition associated with female genital organs and menstrual cycle: Secondary | ICD-10-CM

## 2014-02-25 DIAGNOSIS — R109 Unspecified abdominal pain: Secondary | ICD-10-CM | POA: Insufficient documentation

## 2014-02-25 DIAGNOSIS — Z833 Family history of diabetes mellitus: Secondary | ICD-10-CM | POA: Insufficient documentation

## 2014-02-25 DIAGNOSIS — Z87891 Personal history of nicotine dependence: Secondary | ICD-10-CM | POA: Insufficient documentation

## 2014-02-25 DIAGNOSIS — R1110 Vomiting, unspecified: Secondary | ICD-10-CM | POA: Insufficient documentation

## 2014-02-25 LAB — URINALYSIS, ROUTINE W REFLEX MICROSCOPIC
Bilirubin Urine: NEGATIVE
GLUCOSE, UA: NEGATIVE mg/dL
KETONES UR: NEGATIVE mg/dL
LEUKOCYTES UA: NEGATIVE
Nitrite: NEGATIVE
PH: 6 (ref 5.0–8.0)
Protein, ur: NEGATIVE mg/dL
Specific Gravity, Urine: 1.025 (ref 1.005–1.030)
Urobilinogen, UA: 0.2 mg/dL (ref 0.0–1.0)

## 2014-02-25 LAB — URINE MICROSCOPIC-ADD ON

## 2014-02-25 LAB — WET PREP, GENITAL
Clue Cells Wet Prep HPF POC: NONE SEEN
TRICH WET PREP: NONE SEEN
YEAST WET PREP: NONE SEEN

## 2014-02-25 LAB — RAPID URINE DRUG SCREEN, HOSP PERFORMED
AMPHETAMINES: NOT DETECTED
BARBITURATES: NOT DETECTED
Benzodiazepines: POSITIVE — AB
Cocaine: NOT DETECTED
Opiates: NOT DETECTED
Tetrahydrocannabinol: NOT DETECTED

## 2014-02-25 LAB — POCT PREGNANCY, URINE: PREG TEST UR: NEGATIVE

## 2014-02-25 MED ORDER — KETOROLAC TROMETHAMINE 60 MG/2ML IM SOLN
60.0000 mg | Freq: Once | INTRAMUSCULAR | Status: AC
  Administered 2014-02-25: 60 mg via INTRAMUSCULAR
  Filled 2014-02-25: qty 2

## 2014-02-25 NOTE — Discharge Instructions (Signed)
Pelvic Pain, Female Female pelvic pain can be caused by many different things and start from a variety of places. Pelvic pain refers to pain that is located in the lower half of the abdomen and between your hips. The pain may occur over a short period of time (acute) or may be reoccurring (chronic). The cause of pelvic pain may be related to disorders affecting the female reproductive organs (gynecologic), but it may also be related to the bladder, kidney stones, an intestinal complication, or muscle or skeletal problems. Getting help right away for pelvic pain is important, especially if there has been severe, sharp, or a sudden onset of unusual pain. It is also important to get help right away because some types of pelvic pain can be life threatening.  CAUSES  Below are only some of the causes of pelvic pain. The causes of pelvic pain can be in one of several categories.   Gynecologic.  Pelvic inflammatory disease.  Sexually transmitted infection.  Ovarian cyst or a twisted ovarian ligament (ovarian torsion).  Uterine lining that grows outside the uterus (endometriosis).  Fibroids, cysts, or tumors.  Ovulation.  Pregnancy.  Pregnancy that occurs outside the uterus (ectopic pregnancy).  Miscarriage.  Labor.  Abruption of the placenta or ruptured uterus.  Infection.  Uterine infection (endometritis).  Bladder infection.  Diverticulitis.  Miscarriage related to a uterine infection (septic abortion).  Bladder.  Inflammation of the bladder (cystitis).  Kidney stone(s).  Gastrointenstinal.  Constipation.  Diverticulitis.  Neurologic.  Trauma.  Feeling pelvic pain because of mental or emotional causes (psychosomatic).  Cancers of the bowel or pelvis. EVALUATION  Your caregiver will want to take a careful history of your concerns. This includes recent changes in your health, a careful gynecologic history of your periods (menses), and a sexual history. Obtaining  your family history and medical history is also important. Your caregiver may suggest a pelvic exam. A pelvic exam will help identify the location and severity of the pain. It also helps in the evaluation of which organ system may be involved. In order to identify the cause of the pelvic pain and be properly treated, your caregiver may order tests. These tests may include:   A pregnancy test.  Pelvic ultrasonography.  An X-ray exam of the abdomen.  A urinalysis or evaluation of vaginal discharge.  Blood tests. HOME CARE INSTRUCTIONS   Only take over-the-counter or prescription medicines for pain, discomfort, or fever as directed by your caregiver.   Rest as directed by your caregiver.   Eat a balanced diet.   Drink enough fluids to make your urine clear or pale yellow, or as directed.   Avoid sexual intercourse if it causes pain.   Apply warm or cold compresses to the lower abdomen depending on which one helps the pain.   Avoid stressful situations.   Keep a journal of your pelvic pain. Write down when it started, where the pain is located, and if there are things that seem to be associated with the pain, such as food or your menstrual cycle.  Follow up with your caregiver as directed.  SEEK MEDICAL CARE IF:  Your medicine does not help your pain.  You have abnormal vaginal discharge. SEEK IMMEDIATE MEDICAL CARE IF:   You have heavy bleeding from the vagina.   Your pelvic pain increases.   You feel lightheaded or faint.   You have chills.   You have pain with urination or blood in your urine.   You have uncontrolled   diarrhea or vomiting.   You have a fever or persistent symptoms for more than 3 days.  You have a fever and your symptoms suddenly get worse.   You are being physically or sexually abused.  MAKE SURE YOU:  Understand these instructions.  Will watch your condition.  Will get help if you are not doing well or get worse. Document  Released: 07/06/2004 Document Revised: 02/08/2012 Document Reviewed: 11/29/2011 ExitCare Patient Information 2015 [employer]. This information is not intended to replace advice given to you by your health care provider. Make sure you discuss any questions you have with your health care provider.  

## 2014-02-25 NOTE — MAU Provider Note (Signed)
History     CSN: [id number]  Arrival date and time: 02/25/14 1632   First Provider Initiated Contact with Patient 02/25/14 1803      Chief Complaint  Patient presents with  . Vaginal Bleeding  . Emesis  . Abdominal Pain   HPI Comments: Lindsey GraveSabrina Petty 28 y.o. No obstetric history on file. Presents to MAU with pelvic pain ongoing x 2 weeks. She believes this is an ovarian cyst as she has had one in past. [facility] and pregnancy test was negative, no elevated WBC. She is taking Sprintec and has not missed any days. She recently returned from her honeymoon. Significant psychiatric history.   Vaginal Bleeding Associated symptoms include abdominal pain. Pertinent negatives include no nausea or vomiting.  Emesis  Associated symptoms include abdominal pain.  Abdominal Pain Pertinent negatives include no nausea or vomiting.      Past Medical History  Diagnosis Date  . Anxiety   . History of ovarian cyst   . Migraine   . Back pain   . TMJ syndrome     Past Surgical History  Procedure Laterality Date  . Wisdom tooth extraction    . Fracture surgery Left 2005/2006    hand    Family History  Problem Relation Age of Onset  . Diabetes Mother   . Depression Mother   . Alcohol abuse Mother   . Heart disease Father   . Cancer Father     lung  . Heart disease Paternal Uncle   . Cancer Maternal Grandmother     ? skin cancer  . Diabetes Maternal Grandmother   . Heart disease Paternal Uncle     History  Substance Use Topics  . Smoking status: Former Games developermoker  . Smokeless tobacco: Never Used  . Alcohol Use: 0.5 oz/week    1 drink(s) per week    Allergies:  Allergies  Allergen Reactions  . Penicillins Nausea And Vomiting  . Strattera [Atomoxetine Hcl]     confusion    Prescriptions prior to admission  Medication Sig Dispense Refill  . buPROPion (WELLBUTRIN XL) 150 MG 24 hr tablet Take 1 tablet (150 mg total) by mouth daily. Please  discontinue ALL previous prescriptions for bupropion.  30 tablet  1  . cyclobenzaprine (FLEXERIL) 10 MG tablet Take 1 tablet (10 mg total) by mouth 2 (two) times daily as needed for muscle spasms.  60 tablet  1  . methylphenidate (CONCERTA) 18 MG CR tablet Take 1 tablet (18 mg total) by mouth daily.  30 tablet  0  . norgestimate-ethinyl estradiol (SPRINTEC 28) 0.25-35 MG-MCG tablet Take 1 tablet by mouth daily.      Marland Kitchen. venlafaxine XR (EFFEXOR-XR) 75 MG 24 hr capsule Take 1 capsule (75 mg total) by mouth daily.  30 capsule  1    Review of Systems  Constitutional: Negative.   HENT: Negative.   Gastrointestinal: Positive for abdominal pain. Negative for nausea and vomiting.  Genitourinary: Negative.   Skin: Negative.   Neurological: Negative.   Psychiatric/Behavioral: The patient is nervous/anxious.    Physical Exam   Blood pressure 109/88, pulse 103, temperature 98.4 F (36.9 C), temperature source Oral, resp. rate 16, height 4' 10.5" (1.486 m), weight 69.128 kg (152 lb 6.4 oz), last menstrual period 12/24/2013, SpO2 100.00%.  Physical Exam  Constitutional: She is oriented to person, place, and time. She appears well-developed and well-nourished. No distress.  Quite anxious  HENT:  Head: Normocephalic and atraumatic.  Eyes: Pupils are equal, round, and reactive to light.  GI: Soft. She exhibits no distension and no mass. There is no tenderness. There is no rebound and no guarding.  Genitourinary:  Genital:External negative Vaginal:small amount blood Cervix:closed/ thick Bimanual:nontender   Neurological: She is alert and oriented to person, place, and time.  Skin: Skin is warm and dry.   [place] Transvaginal Non-ob  02/25/2014   CLINICAL DATA:  Pelvic pain for 2 weeks, irregular menses  EXAM: TRANSABDOMINAL AND TRANSVAGINAL ULTRASOUND OF PELVIS  TECHNIQUE: Both transabdominal and transvaginal ultrasound examinations of the pelvis were performed. Transabdominal technique was performed for  global imaging of the pelvis including uterus, ovaries, adnexal regions, and pelvic cul-de-sac. It was necessary to proceed with endovaginal exam following the transabdominal exam to visualize the endometrium and ovaries.  COMPARISON:  No similar prior exam is available at this institution for comparison or on [facility].  FINDINGS: Uterus  Measurements: 6.8 x 4.4 x 3.2 cm. No fibroids or other mass visualized.  Endometrium  Thickness: 9 mm.  No focal abnormality visualized.  Right ovary  Measurements: 3.2 x 1.6 x 1.4 cm. Normal appearance/no adnexal mass.  Left ovary  Measurements: 1.9 x 1.6 x 1.0 cm. Normal appearance/no adnexal mass.  Other findings  No free fluid.  IMPRESSION: Normal exam.   Electronically Signed   By: Christiana PellantGretchen  Green M.D.   On: 02/25/2014 19:21   [place] Pelvis Complete  02/25/2014   CLINICAL DATA:  Pelvic pain for 2 weeks, irregular menses  EXAM: TRANSABDOMINAL AND TRANSVAGINAL ULTRASOUND OF PELVIS  TECHNIQUE: Both transabdominal and transvaginal ultrasound examinations of the pelvis were performed. Transabdominal technique was performed for global imaging of the pelvis including uterus, ovaries, adnexal regions, and pelvic cul-de-sac. It was necessary to proceed with endovaginal exam following the transabdominal exam to visualize the endometrium and ovaries.  COMPARISON:  No similar prior exam is available at this institution for comparison or on [facility].  FINDINGS: Uterus  Measurements: 6.8 x 4.4 x 3.2 cm. No fibroids or other mass visualized.  Endometrium  Thickness: 9 mm.  No focal abnormality visualized.  Right ovary  Measurements: 3.2 x 1.6 x 1.4 cm. Normal appearance/no adnexal mass.  Left ovary  Measurements: 1.9 x 1.6 x 1.0 cm. Normal appearance/no adnexal mass.  Other findings  No free fluid.  IMPRESSION: Normal exam.   Electronically Signed   By: Christiana PellantGretchen  Green M.D.   On: 02/25/2014 19:21    MAU Course  Procedures  MDM  Toradol 60 mg IM UDS  Assessment and Plan   A:  Pelvic Pain  P: Orders as above Advised to find GYN/ PCP to care for her  Return to MAU for emergencies only  Carolynn ServeBarefoot, Laurieanne Galloway Miller 02/25/2014, 6:16 PM

## 2014-02-25 NOTE — MAU Note (Signed)
Patient states she has been having abdominal pain and irregular bleeding for about 4 weeks. Has had nausea and vomiting for about 5 5 1/2 weeks. Has a history of ovarian cysts 6-7 years ago. Is taking Sprintec x 90 days. Last normal period was 5-4. [facility] and referred to MAU.

## 2014-02-25 NOTE — MAU Provider Note (Signed)
Attestation of Attending Supervision of Advanced Practitioner (CNM/NP): Evaluation and management procedures were performed by the Advanced Practitioner under my supervision and collaboration.  I have reviewed the Advanced Practitioner's note and chart, and I agree with the management and plan.  HARRAWAY-SMITH, Briseidy Spark 7:46 PM

## 2014-02-26 LAB — GC/CHLAMYDIA PROBE AMP
CT Probe RNA: NEGATIVE
GC Probe RNA: NEGATIVE

## 2014-04-10 ENCOUNTER — Encounter (HOSPITAL_COMMUNITY): Payer: Self-pay

## 2014-05-06 ENCOUNTER — Encounter (HOSPITAL_COMMUNITY): Payer: Self-pay | Admitting: Pharmacist

## 2014-05-06 NOTE — H&P (Signed)
GYN H&P:  28yo G0 who presents for diagnostic laparoscopy due to persistent pelvic pain since July.  The pain is mostly RLQ and radiates to her back. Pain is constant, but intensity changes from 2-7/10. She had tried conservative management with NSAIDs and tylenol with minimal improvement. A work up has been done both as an outpatient and during visits to [facility].  No abnormal findings were identified on [place] on within her lab work.  She has been on Sprintec with cycles q 3mos, per patient she was diagnosed with endometriosis (though she never had a laparoscopy) by a previous physician. She strongly desires to find out if she does or does not have endometriosis as she is concerned about the nature of her pain and potential infertility concerns.  Pt reports no vaginal discharge, itching or burning. No F/C/CP/SOB. Pt states some nausea, but tolerating general diet, denies nausea or vomiting currently.   Current Medication:  Taking  Trazodone HCl 150 MG Tablet 1 -2 tablets at bedtime as needed     Clonazepam 1 MG Tablet 1 tablet TID prn     Flexeril 10 mg 30 10 mg Tablets one tablet prn TMJ pain     Wellbutrin 100 MG Tablet 1 tablet Twice a day, Notes: total  daily/currently on  QD     Ritalin 10 MG Tablet 1 tablet Twice a day     Omeprazole 20 MG Capsule Delayed Release     Tylenol     Sprintec 28    Medical History:   Asthmatic Bronchitis     ADHD     Depression with Anxiety   Allergies/Intolerance:   Strattera - global reaction     Penicillin - nausea & vomiting   Gyn History:   Sexual activity currently sexually active.  Periods : every 3 months. Currently on menses LMP 04/30/14.  Birth control ocp.  Last pap smear date 09/2013 Negative.  Denies Last mammogram date.  Abnormal pap smear yes.   OB History:   Never been pregnant per patient.   Surgical History:   Oral surgery 2011   Hospitalization:   Broken Right hand 2008     Left hand cut 2005     Stomach cyst ?  Burst? 2008   Family History:   Father: deceased, Heart attack before 48 y/o, diagnosed with HTN, CAD    Mother: alive    Paternal Grand Father: deceased    Paternal Grand Mother: deceased, Lung Cancer    Maternal Grand Father: alive    Maternal Grand Mother: alive, diagnosed with DM    Brother 1: alive, Depression    Sister 1: alive, Anemia, Asthma     Sister 2: alive, Anemia   Social History:  General Tobacco use  cigarettes: Former smoker  Pack-year Hx: 2  Tobacco history last updated 05/31/2013  no Smoking.  Alcohol: yes, 2+ per week.  no Recreational drug use.  ROS: CONSTITUTIONAL no Chills. no Fever. no Skin rash.  HEENT no" options="" propid="91" itemid="[id number]" categoryid="[id number]" encounterid="[id number]"Blurrred vision no.  CARDIOLOGY no Chest pain.  RESPIRATORY no Shortness of breath. no Cough.  GASTROENTEROLOGY no Appetite change. no Change in bowel movements. long-standing issue" options="no,yes" propid="91" itemid="10501" categoryid="[id number]" encounterid="[id number]"Constipation yes, long-standing issue.  UROLOGY no Urinary frequency. no Urinary incontinence. no Urinary urgency.  FEMALE REPRODUCTIVE no Breast lumps or discharge. no Breast pain.  NEUROLOGY no Dizziness. no Headache.   (in office on 9/11) Objective:  Vitals:  Wt 152, Wt change 2 lb, Ht 60, BMI 29.68,  Temp 97.7, Pulse sitting 99, BP sitting 133/91  Past Results:  Examination:  General Examination: alert, oriented, NAD SKIN: normal, no rash.  CV: RRR Lungs: CTAB Abd: no masses palpated, soft, no rebound, no guarding, minimal RLQ pain with deep palpation GU: No external lesions, Vagina - pink moist mucosa, no lesions or abnormal discharge, on menses with minimal blood noted in vault, cervix - closed, no discharge or lesions. No CMT. No adnexal masses bilaterally. Uterus: nontender and normal size on palpation EXT: no edema present, no calf tenderness bilaterally  Labs/Imaging:  7/6 TVUS- no  adnexal masses, no free fluid, normal uterus. 7/6: GC/C negative, urine pregnancy neg  A/P: 28yo G0 who presents for diagnostic laparoscopy with possible intervention due to persistent pelvic pain. -NPO -LR @ 125cc/hr -SCDs to OR -Antibiotics not indicated -Labs to be completed -Risk, benefit and indications reviewed with patient in office on 9/11- discussed risk of bleeding, infection and injury to surrounding organs including but not limited to bowel, bladder, and ureters. Reviewed potential findings- we reviewed that no matter what pt does not desire removal of her fallopian tubes or ovaries even if damage is permanent and irrepairable.  All questions and concerns were addressed with her and her husband.  [name], DO [phone] (pager) [phone] (office)

## 2014-05-08 ENCOUNTER — Encounter (HOSPITAL_COMMUNITY): Payer: Self-pay | Admitting: Anesthesiology

## 2014-05-08 ENCOUNTER — Ambulatory Visit (HOSPITAL_COMMUNITY): Payer: BC Managed Care – PPO | Admitting: Anesthesiology

## 2014-05-08 ENCOUNTER — Ambulatory Visit (HOSPITAL_COMMUNITY)
Admission: RE | Admit: 2014-05-08 | Discharge: 2014-05-08 | Disposition: A | Discharge status: Home / Self Care | Payer: BC Managed Care – PPO | Source: Ambulatory Visit | Attending: Obstetrics & Gynecology | Admitting: Obstetrics & Gynecology

## 2014-05-08 ENCOUNTER — Encounter (HOSPITAL_COMMUNITY)
Admission: RE | Disposition: A | Discharge status: Home / Self Care | Payer: Self-pay | Source: Ambulatory Visit | Attending: Obstetrics & Gynecology

## 2014-05-08 ENCOUNTER — Encounter (HOSPITAL_COMMUNITY): Payer: BC Managed Care – PPO | Admitting: Anesthesiology

## 2014-05-08 DIAGNOSIS — N949 Unspecified condition associated with female genital organs and menstrual cycle: Secondary | ICD-10-CM | POA: Diagnosis present

## 2014-05-08 DIAGNOSIS — Q504 Embryonic cyst of fallopian tube: Secondary | ICD-10-CM | POA: Insufficient documentation

## 2014-05-08 DIAGNOSIS — M26609 Unspecified temporomandibular joint disorder, unspecified side: Secondary | ICD-10-CM | POA: Insufficient documentation

## 2014-05-08 DIAGNOSIS — Z87891 Personal history of nicotine dependence: Secondary | ICD-10-CM | POA: Diagnosis not present

## 2014-05-08 DIAGNOSIS — N838 Other noninflammatory disorders of ovary, fallopian tube and broad ligament: Secondary | ICD-10-CM | POA: Insufficient documentation

## 2014-05-08 DIAGNOSIS — Q505 Embryonic cyst of broad ligament: Secondary | ICD-10-CM

## 2014-05-08 HISTORY — DX: Pneumonia, unspecified organism: J18.9

## 2014-05-08 HISTORY — DX: Unspecified asthma, uncomplicated: J45.909

## 2014-05-08 HISTORY — DX: Gastro-esophageal reflux disease without esophagitis: K21.9

## 2014-05-08 HISTORY — PX: LAPAROSCOPY: SHX197

## 2014-05-08 LAB — BASIC METABOLIC PANEL
Anion gap: 15 (ref 5–15)
BUN: 12 mg/dL (ref 6–23)
CALCIUM: 9.5 mg/dL (ref 8.4–10.5)
CHLORIDE: 100 meq/L (ref 96–112)
CO2: 24 meq/L (ref 19–32)
CREATININE: 0.71 mg/dL (ref 0.50–1.10)
GFR calc Af Amer: >90 mL/min (ref >90)
GFR calc non Af Amer: >90 mL/min (ref >90)
Glucose, Bld: 87 mg/dL (ref 70–99)
Potassium: 3.9 mEq/L (ref 3.7–5.3)
Sodium: 139 mEq/L (ref 137–147)

## 2014-05-08 LAB — CBC
HEMATOCRIT: 36.9 % (ref 36.0–46.0)
Hemoglobin: 12.8 g/dL (ref 12.0–15.0)
MCH: 29 pg (ref 26.0–34.0)
MCHC: 34.7 g/dL (ref 30.0–36.0)
MCV: 83.7 fL (ref 78.0–100.0)
PLATELETS: 318 10*3/uL (ref 150–400)
RBC: 4.41 MIL/uL (ref 3.87–5.11)
RDW: 13.4 % (ref 11.5–15.5)
WBC: 7.3 10*3/uL (ref 4.0–10.5)

## 2014-05-08 LAB — PREGNANCY, URINE: Preg Test, Ur: NEGATIVE

## 2014-05-08 SURGERY — LAPAROSCOPY, DIAGNOSTIC
Anesthesia: General | Site: Abdomen

## 2014-05-08 MED ORDER — KETOROLAC TROMETHAMINE 30 MG/ML IJ SOLN
INTRAMUSCULAR | Status: AC
  Filled 2014-05-08: qty 1

## 2014-05-08 MED ORDER — NEOSTIGMINE METHYLSULFATE 10 MG/10ML IV SOLN
INTRAVENOUS | Status: DC | PRN
  Administered 2014-05-08: 3 mg via INTRAVENOUS

## 2014-05-08 MED ORDER — IBUPROFEN 600 MG PO TABS: 600.0000 mg | ORAL_TABLET | Freq: Four times a day (QID) | ORAL | Status: AC | PRN

## 2014-05-08 MED ORDER — MIDAZOLAM HCL 2 MG/2ML IJ SOLN
INTRAMUSCULAR | Status: AC
  Filled 2014-05-08: qty 2

## 2014-05-08 MED ORDER — KETOROLAC TROMETHAMINE 30 MG/ML IJ SOLN
15.0000 mg | Freq: Once | INTRAMUSCULAR | Status: AC | PRN
  Administered 2014-05-08: 30 mg via INTRAVENOUS

## 2014-05-08 MED ORDER — ROCURONIUM BROMIDE 100 MG/10ML IV SOLN
INTRAVENOUS | Status: AC
  Filled 2014-05-08: qty 1

## 2014-05-08 MED ORDER — SODIUM CHLORIDE 0.9 % IJ SOLN
INTRAMUSCULAR | Status: AC
  Filled 2014-05-08: qty 10

## 2014-05-08 MED ORDER — FENTANYL CITRATE 0.05 MG/ML IJ SOLN
INTRAMUSCULAR | Status: AC
Start: 2014-05-08 — End: 2014-05-08
  Filled 2014-05-08: qty 5

## 2014-05-08 MED ORDER — FENTANYL CITRATE 0.05 MG/ML IJ SOLN: 25.0000 ug | INTRAMUSCULAR | Status: DC | PRN

## 2014-05-08 MED ORDER — ONDANSETRON HCL 4 MG/2ML IJ SOLN
INTRAMUSCULAR | Status: DC | PRN
  Administered 2014-05-08: 4 mg via INTRAVENOUS

## 2014-05-08 MED ORDER — BUPIVACAINE HCL (PF) 0.25 % IJ SOLN
INTRAMUSCULAR | Status: DC | PRN
  Administered 2014-05-08 (×2): 10 mL
  Administered 2014-05-08: 8 mL

## 2014-05-08 MED ORDER — PROPOFOL 10 MG/ML IV BOLUS
INTRAVENOUS | Status: DC | PRN
  Administered 2014-05-08: 160 mg via INTRAVENOUS

## 2014-05-08 MED ORDER — FENTANYL CITRATE 0.05 MG/ML IJ SOLN
INTRAMUSCULAR | Status: DC | PRN
  Administered 2014-05-08: 50 ug via INTRAVENOUS
  Administered 2014-05-08 (×2): 100 ug via INTRAVENOUS

## 2014-05-08 MED ORDER — GLYCOPYRROLATE 0.2 MG/ML IJ SOLN
INTRAMUSCULAR | Status: DC | PRN
  Administered 2014-05-08: 0.4 mg via INTRAVENOUS

## 2014-05-08 MED ORDER — LIDOCAINE HCL (CARDIAC) 20 MG/ML IV SOLN
INTRAVENOUS | Status: AC
  Filled 2014-05-08: qty 5

## 2014-05-08 MED ORDER — SILVER NITRATE-POT NITRATE 75-25 % EX MISC
CUTANEOUS | Status: DC | PRN
  Administered 2014-05-08: 1 via TOPICAL

## 2014-05-08 MED ORDER — LIDOCAINE HCL (CARDIAC) 20 MG/ML IV SOLN
INTRAVENOUS | Status: DC | PRN
  Administered 2014-05-08: 80 mg via INTRAVENOUS

## 2014-05-08 MED ORDER — PROPOFOL 10 MG/ML IV EMUL
INTRAVENOUS | Status: AC
  Filled 2014-05-08: qty 20

## 2014-05-08 MED ORDER — HYDROMORPHONE HCL PF 1 MG/ML IJ SOLN
INTRAMUSCULAR | Status: AC
  Filled 2014-05-08: qty 1

## 2014-05-08 MED ORDER — SCOPOLAMINE 1 MG/3DAYS TD PT72
MEDICATED_PATCH | TRANSDERMAL | Status: AC
  Administered 2014-05-08: 1.5 mg via TRANSDERMAL
  Filled 2014-05-08: qty 1

## 2014-05-08 MED ORDER — LACTATED RINGERS IV SOLN
INTRAVENOUS | Status: DC
  Administered 2014-05-08 (×2): via INTRAVENOUS

## 2014-05-08 MED ORDER — NEOSTIGMINE METHYLSULFATE 10 MG/10ML IV SOLN
INTRAVENOUS | Status: AC
  Filled 2014-05-08: qty 1

## 2014-05-08 MED ORDER — ONDANSETRON HCL 4 MG/2ML IJ SOLN
INTRAMUSCULAR | Status: AC
  Filled 2014-05-08: qty 2

## 2014-05-08 MED ORDER — MEPERIDINE HCL 25 MG/ML IJ SOLN: 6.2500 mg | INTRAMUSCULAR | Status: DC | PRN

## 2014-05-08 MED ORDER — SODIUM CHLORIDE 0.9 % IR SOLN
Status: DC | PRN
  Administered 2014-05-08: 1000 mL

## 2014-05-08 MED ORDER — HYDROMORPHONE HCL PF 1 MG/ML IJ SOLN
INTRAMUSCULAR | Status: DC | PRN
  Administered 2014-05-08: 1 mg via INTRAVENOUS
  Administered 2014-05-08 (×2): 0.5 mg via INTRAVENOUS

## 2014-05-08 MED ORDER — MIDAZOLAM HCL 2 MG/2ML IJ SOLN
INTRAMUSCULAR | Status: DC | PRN
  Administered 2014-05-08 (×2): 2 mg via INTRAVENOUS

## 2014-05-08 MED ORDER — SCOPOLAMINE 1 MG/3DAYS TD PT72
1.0000 | MEDICATED_PATCH | Freq: Once | TRANSDERMAL | Status: DC
  Administered 2014-05-08: 1.5 mg via TRANSDERMAL

## 2014-05-08 MED ORDER — OXYCODONE-ACETAMINOPHEN 5-325 MG PO TABS
1.0000 | ORAL_TABLET | Freq: Once | ORAL | Status: AC
  Administered 2014-05-08: 1 via ORAL

## 2014-05-08 MED ORDER — DOCUSATE SODIUM 100 MG PO CAPS: 100.0000 mg | ORAL_CAPSULE | Freq: Two times a day (BID) | ORAL | Status: AC

## 2014-05-08 MED ORDER — OXYCODONE-ACETAMINOPHEN 5-325 MG PO TABS
ORAL_TABLET | ORAL | Status: AC
  Filled 2014-05-08: qty 1

## 2014-05-08 MED ORDER — ROCURONIUM BROMIDE 100 MG/10ML IV SOLN
INTRAVENOUS | Status: DC | PRN
  Administered 2014-05-08: 40 mg via INTRAVENOUS

## 2014-05-08 MED ORDER — PROMETHAZINE HCL 25 MG/ML IJ SOLN: 6.2500 mg | INTRAMUSCULAR | Status: DC | PRN

## 2014-05-08 MED ORDER — BUPIVACAINE HCL (PF) 0.25 % IJ SOLN
INTRAMUSCULAR | Status: AC
  Filled 2014-05-08: qty 30

## 2014-05-08 MED ORDER — LACTATED RINGERS IV SOLN
INTRAVENOUS | Status: DC
Start: 2014-05-08 — End: 2014-05-08

## 2014-05-08 MED ORDER — DEXAMETHASONE SODIUM PHOSPHATE 4 MG/ML IJ SOLN
INTRAMUSCULAR | Status: AC
  Filled 2014-05-08: qty 1

## 2014-05-08 MED ORDER — GLYCOPYRROLATE 0.2 MG/ML IJ SOLN
INTRAMUSCULAR | Status: AC
  Filled 2014-05-08: qty 2

## 2014-05-08 MED ORDER — SODIUM CHLORIDE 0.9 % IJ SOLN
INTRAMUSCULAR | Status: DC | PRN
  Administered 2014-05-08: 10 mL

## 2014-05-08 MED ORDER — DEXAMETHASONE SODIUM PHOSPHATE 10 MG/ML IJ SOLN
INTRAMUSCULAR | Status: DC | PRN
  Administered 2014-05-08: 4 mg via INTRAVENOUS

## 2014-05-08 SURGICAL SUPPLY — 39 items
BLADE SURG 11 STRL SS (BLADE) ×3 IMPLANT
CABLE HIGH FREQUENCY MONO STRZ (ELECTRODE) ×3 IMPLANT
CHLORAPREP W/TINT 26ML (MISCELLANEOUS) ×3 IMPLANT
CLOSURE WOUND 1/4X4 (GAUZE/BANDAGES/DRESSINGS)
CLOTH BEACON ORANGE TIMEOUT ST (SAFETY) ×3 IMPLANT
DERMABOND ADVANCED (GAUZE/BANDAGES/DRESSINGS) ×2
DERMABOND ADVANCED .7 DNX12 (GAUZE/BANDAGES/DRESSINGS) ×1 IMPLANT
DEVICE TROCAR PUNCTURE CLOSURE (ENDOMECHANICALS) IMPLANT
DRSG COVADERM PLUS 2X2 (GAUZE/BANDAGES/DRESSINGS) ×9 IMPLANT
DRSG OPSITE POSTOP 3X4 (GAUZE/BANDAGES/DRESSINGS) IMPLANT
FORCEPS CUTTING 33CM 5MM (CUTTING FORCEPS) IMPLANT
GLOVE BIO SURGEON STRL SZ7 (GLOVE) ×3 IMPLANT
GLOVE BIOGEL PI IND STRL 6.5 (GLOVE) ×2 IMPLANT
GLOVE BIOGEL PI IND STRL 7.0 (GLOVE) ×2 IMPLANT
GLOVE BIOGEL PI INDICATOR 6.5 (GLOVE) ×4
GLOVE BIOGEL PI INDICATOR 7.0 (GLOVE) ×4
GLOVE ECLIPSE 6.5 STRL STRAW (GLOVE) ×6 IMPLANT
GLOVE SURG SS PI 7.0 STRL IVOR (GLOVE) ×3 IMPLANT
GOWN STRL REUS W/TWL LRG LVL3 (GOWN DISPOSABLE) ×9 IMPLANT
MANIPULATOR UTERINE 4.5 ZUMI (MISCELLANEOUS) IMPLANT
NEEDLE INSUFFLATION 120MM (ENDOMECHANICALS) ×3 IMPLANT
PACK LAPAROSCOPY BASIN (CUSTOM PROCEDURE TRAY) ×3 IMPLANT
POUCH SPECIMEN RETRIEVAL 10MM (ENDOMECHANICALS) IMPLANT
PROTECTOR NERVE ULNAR (MISCELLANEOUS) ×3 IMPLANT
SET IRRIG TUBING LAPAROSCOPIC (IRRIGATION / IRRIGATOR) IMPLANT
SHEARS HARMONIC ACE PLUS 36CM (ENDOMECHANICALS) IMPLANT
STRIP CLOSURE SKIN 1/4X4 (GAUZE/BANDAGES/DRESSINGS) IMPLANT
SURGIFLO W/THROMBIN 8M KIT (HEMOSTASIS) ×3 IMPLANT
SUT MON AB 4-0 PS1 27 (SUTURE) ×3 IMPLANT
SUT VIC AB 0 CT1 27 (SUTURE)
SUT VIC AB 0 CT1 27XBRD ANBCTR (SUTURE) IMPLANT
SUT VICRYL 0 UR6 27IN ABS (SUTURE) IMPLANT
TOWEL OR 17X24 6PK STRL BLUE (TOWEL DISPOSABLE) ×6 IMPLANT
TRAY FOLEY CATH 14FR (SET/KITS/TRAYS/PACK) ×3 IMPLANT
TROCAR XCEL NON-BLD 11X100MML (ENDOMECHANICALS) IMPLANT
TROCAR XCEL NON-BLD 5MMX100MML (ENDOMECHANICALS) ×3 IMPLANT
TROCAR XCEL OPT SLVE 5M 100M (ENDOMECHANICALS) ×6 IMPLANT
WARMER LAPAROSCOPE (MISCELLANEOUS) ×3 IMPLANT
WATER STERILE IRR 1000ML POUR (IV SOLUTION) ×3 IMPLANT

## 2014-05-08 NOTE — Interval H&P Note (Signed)
History and Physical Interval Note:  05/08/2014 8:14 AM  Lindsey Petty  has presented today for surgery, with the diagnosis of Pelvic Pain/AUB  The various methods of treatment have been discussed with the patient and family. After consideration of risks, benefits and other options for treatment, the patient has consented to  Procedure(s): LAPAROSCOPY DIAGNOSTIC (N/A) as a surgical intervention .  The patient's history has been reviewed, patient examined, no change in status, stable for surgery.  I have reviewed the patient's chart and labs.  Questions were answered to the patient's satisfaction.     [name], M

## 2014-05-08 NOTE — Anesthesia Preprocedure Evaluation (Signed)
Anesthesia Evaluation  Patient identified by MRN, date of birth, ID band Patient awake    Reviewed: Allergy & Precautions, H&P , NPO status , Patient's Chart, lab work & pertinent test results  Airway Mallampati: III TM Distance: >3 FB Neck ROM: full  Mouth opening: Limited Mouth Opening  Dental no notable dental hx. (+) Teeth Intact   Pulmonary former smoker,    Pulmonary exam normal       Cardiovascular negative cardio ROS      Neuro/Psych    GI/Hepatic Neg liver ROS,   Endo/Other  negative endocrine ROS  Renal/GU negative Renal ROS     Musculoskeletal TMJ. Warned about the potential for exacerbation.   Abdominal Normal abdominal exam  (+)   Peds  Hematology negative hematology ROS (+)   Anesthesia Other Findings   Reproductive/Obstetrics negative OB ROS                           Anesthesia Physical Anesthesia Plan  ASA: II  Anesthesia Plan: General   Post-op Pain Management:    Induction: Intravenous  Airway Management Planned: Oral ETT  Additional Equipment:   Intra-op Plan:   Post-operative Plan: Extubation in OR  Informed Consent: I have reviewed the patients History and Physical, chart, labs and discussed the procedure including the risks, benefits and alternatives for the proposed anesthesia with the patient or authorized representative who has indicated his/her understanding and acceptance.   Dental Advisory Given  Plan Discussed with: CRNA and Surgeon  Anesthesia Plan Comments: (Will position in stirrups prior to induction due to back pain.)        Anesthesia Quick Evaluation

## 2014-05-08 NOTE — Op Note (Signed)
PREOPERATIVE DIAGNOSIS:  Pelvic pain POSTOPERATIVE DIAGNOSIS: Pelvic pain, Paratubal cyst (cyst of Morgagni) PROCEDURE PERFORMED: Diagnostic laparoscopy, removal of paratubal cyst SURGEON: Dr. [name] ASSISTANT:Dr. Marylou Flesher ANESTHESIA: General endotracheal.  ESTIMATED BLOOD LOSS: 10cc.  URINE OUTPUT: 200cc of clear yellow urine at the end of the procedure.  IV FLUIDS: 11200cc of crystalloid.  SPECIMEN(S): paratubal cyst COMPLICATIONS: None.  CONDITION: Stable.  FINDINGS: No ascites or peritoneal studding was appreciated.  Liver, gallbladder and bowel appeared grossly normal.  Appendix visualized, unremarkable.  Uterus normal size and shape.  Ovaries and tubes were normal in appearance except for small <1cm paratubal cyst   Informed consent was obtained from the patient prior to taking her to the operating room where anesthesia was found to be adequate. She was placed in dorsal lithotomy position and examined under anesthesia. She was prepped and draped in normal sterile fashion. The bladder was catheterized with a foley under sterile technique.  A bi-valve speculum was then placed and the anterior lip of the cervix was grasped with the single tooth tenaculum. The [place] uterine manipulator was then advanced into the uterus to provide uterine mobility. The speculum and tenaculum were then removed.  Attention was then turned to the patients abdomen where 10cc of local anesthestic was placed.  A 10 mm infraumbilical skin incision was made with the scalpel. The veress needle was carefully introduced into the peritoneal cavity while tenting the abdominal wall. Intraperitoneal placement was confirmed by use of a saline-drop test.  The gas was connected and confirmed intrabdominal placement by a low initial pressure of . The abdomen was then insuflated with CO2 gas. The trocar and sleeve were then advanced without difficulty into the abdomen under direct visualization. Intraabdominal  placement was confirmed by the laparoscope and surveillance of the abdomen was performed. Grossly normal appearing abdomen with findings as mentioned above.  Two additional 5mm skin incision were made in the left and right lower quadrants with placement of the trocar under direct visualization.   Careful inspection of the abdomen was performed with care made to examine both ovaries and fallopian tubes to the fimbrae.  The small right paratubal cyst was noted. Using laparoscopic scissors with energy the cyst was removed without difficulty and sent to pathology.  There was some difficulty visualizing the appendix due to a posterior position; however, no discrete adhesions were appreciated. Upon final inspection of the abdomen a hematoma was noted within the fascia adjacent to the RLQ port.  The hematoma was monitored to ensure no expansion was noted and Surgiflo was place within the trocar incision.  The abdomen was again inspected no bleeding was appreciated.  The trocars were removed under direct visualization and the gas was allowed to fully escape. The port sites were then closed with dermabond. The manipulator was removed from the cervix.  Hemostasis from the tenaculum site was obtained with silver nitrate. The patient tolerated the procedure well with all sponge, lap, and needle counts correct. The patient was taken to recovery in stable condition.   [name], DO [phone] (pager) [phone] (office)

## 2014-05-08 NOTE — Transfer of Care (Signed)
Immediate Anesthesia Transfer of Care Note  Patient: Lindsey Petty  Procedure(s) Performed: Procedure(s): LAPAROSCOPY DIAGNOSTIC WITH REMOVAL OF LEFT PARATUBAL CYST  (N/A)  Patient Location: PACU  Anesthesia Type:General  Level of Consciousness: awake, alert , oriented and patient cooperative  Airway & Oxygen Therapy: Patient Spontanous Breathing and Patient connected to nasal cannula oxygen  Post-op Assessment: Report given to PACU RN and Post -op Vital signs reviewed and stable  Post vital signs: Reviewed and stable  Complications: No apparent anesthesia complications

## 2014-05-08 NOTE — Discharge Instructions (Signed)
HOME INSTRUCTIONS  Please note any unusual or excessive bleeding, pain, swelling. Mild dizziness or drowsiness are normal for about 24 hours after surgery.   Shower when comfortable  Restrictions: No driving for 24 hours or while taking pain medications.  Activity:  No heavy lifting (> 10 lbs), nothing in vagina (no tampons, douching, or intercourse) x 4 weeks; no tub baths for 4 weeks Vaginal spotting is expected but if your bleeding is heavy, period like,  please call the office   Incision: the dressing may be removed in the next 1-2 days.  The dermabond underneath will fall off when they are ready to; you may clean your incision with mild soap and water but do not rub or scrub the incision site.  You may experience slight bloody drainage from your incision periodically.  This is normal.  If you experience a large amount of drainage or the incision opens, please call your physician who will likely direct you to the emergency department.  Diet:  You may eat whatever you want.  Do not eat large meals.  Eat small frequent meals throughout the day.  Continue to drink a good amount of water at least 6-8 glasses of water per day, hydration is very important for the healing process.  Pain Management: Take Motrin and/or Percocet as prescribed/needed for pain.  Always take prescription pain medication with food, it may cause constipation, increase fluids and fiber and you may want to take an over-the-counter stool softener like Colace as needed up to 2x a day.    Alcohol -- Avoid for 24 hours and while taking pain medications.  Nausea: Take sips of ginger ale or soda  Fever -- Call physician if temperature over 101 degrees  Follow up:  If you do not already have a follow up appointment scheduled, please call the office at [phone].  If you experience fever (a temperature greater than 100.4), pain unrelieved by pain medication, shortness of breath, swelling of a single leg, or any other symptoms  which are concerning to you please the office immediately.

## 2014-05-09 ENCOUNTER — Encounter (HOSPITAL_COMMUNITY): Payer: Self-pay | Admitting: Obstetrics & Gynecology

## 2014-05-09 NOTE — Anesthesia Postprocedure Evaluation (Signed)
Anesthesia Post Note  Patient: Lindsey Petty  Procedure(s) Performed: Procedure(s) (LRB): LAPAROSCOPY DIAGNOSTIC WITH REMOVAL OF LEFT PARATUBAL CYST  (N/A)  Anesthesia type: General  Patient location: PACU  Post pain: Pain level controlled  Post assessment: Post-op Vital signs reviewed  Last Vitals:  Filed Vitals:   05/08/14 1145  BP:   Pulse: 100  Temp: 36.7 C  Resp:     Post vital signs: Reviewed  Level of consciousness: sedated  Complications: No apparent anesthesia complications

## 2015-03-05 IMAGING — US US PELVIS COMPLETE
1 series · 14 of 25 positions shown · non-contrast
Comparison: No similar prior exam is available at this institution
for comparison or on [HOSPITAL] PACS.

CLINICAL DATA: Pelvic pain for 2 weeks, irregular menses

EXAM:
TRANSABDOMINAL AND TRANSVAGINAL ULTRASOUND OF PELVIS
TECHNIQUE: Both transabdominal and transvaginal ultrasound examinations of the
pelvis were performed. Transabdominal technique was performed for
global imaging of the pelvis including uterus, ovaries, adnexal
regions, and pelvic cul-de-sac. It was necessary to proceed with
endovaginal exam following the transabdominal exam to visualize the
endometrium and ovaries.

[Series 1: us pelvis complete · 14 of 39 slices shown]
[im 1/39]
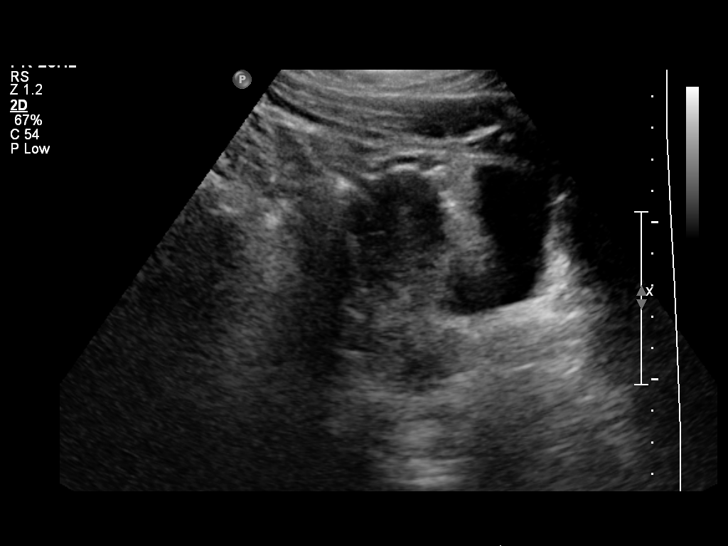
[im 4/39]
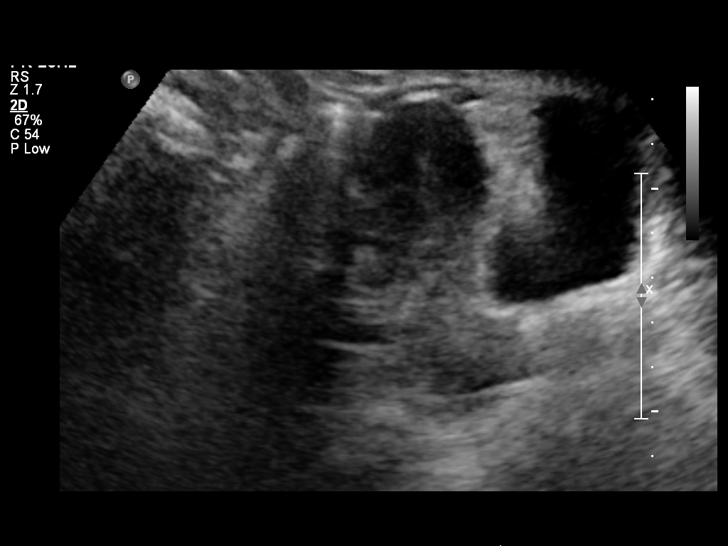
[im 7/39]
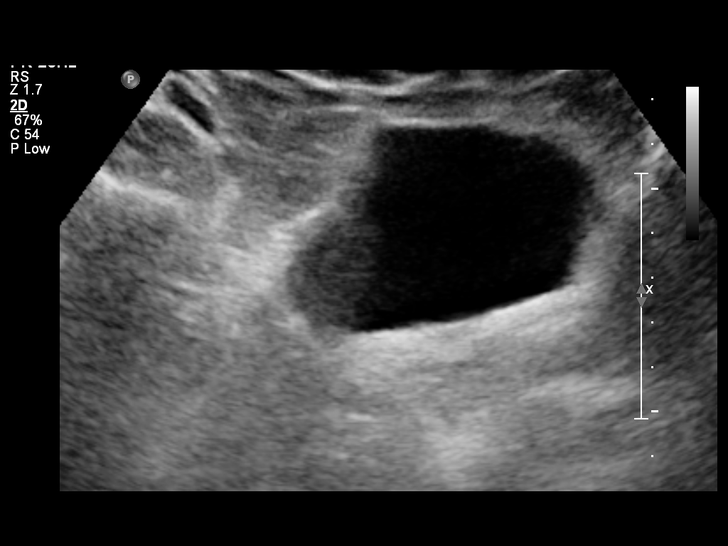
[im 10/39]
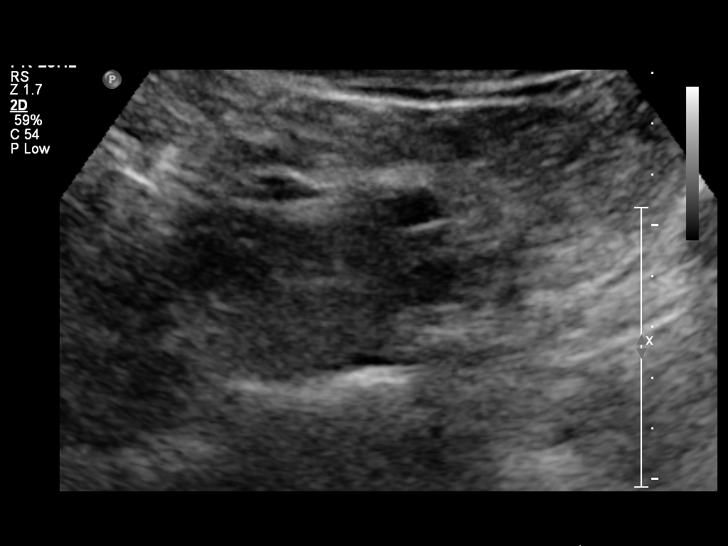
[im 13/39]
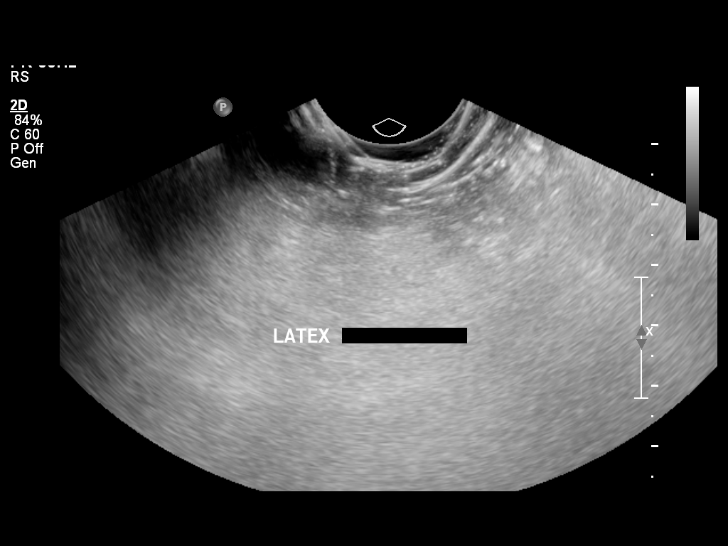
[im 15/39]
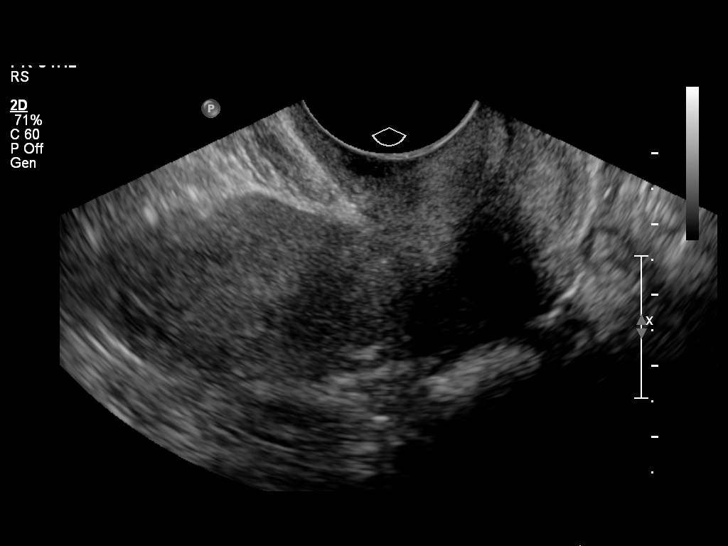
[im 18/39]
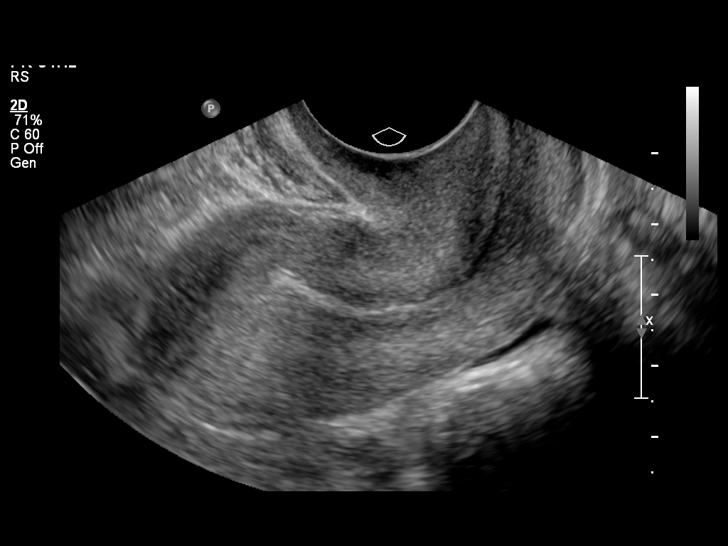
[im 21/39]
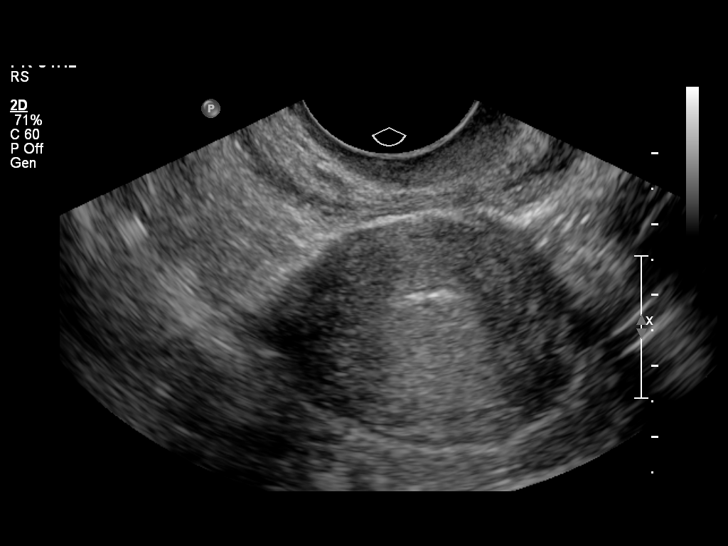
[im 24/39]
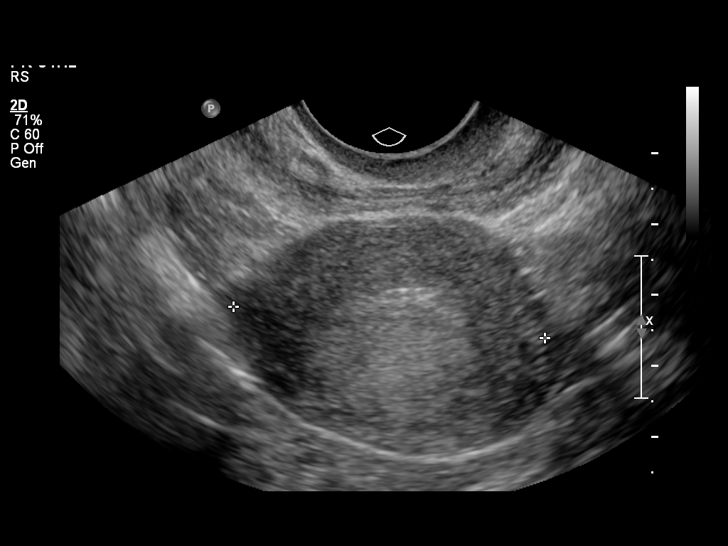
[im 26/39]
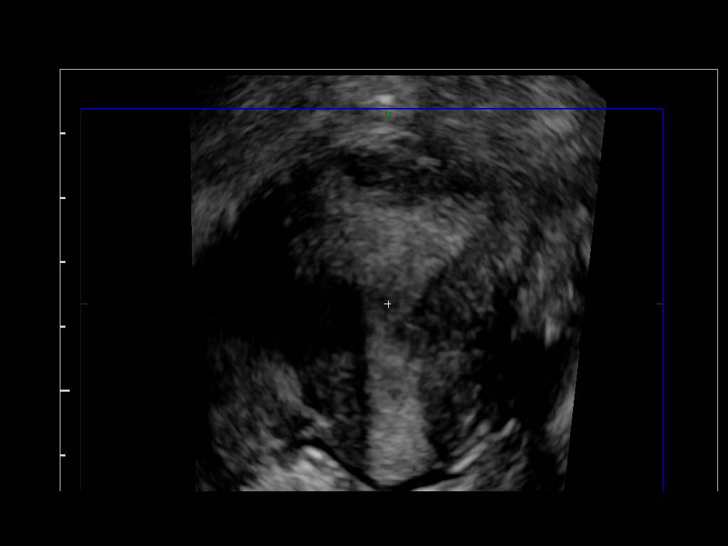
[im 29/39]
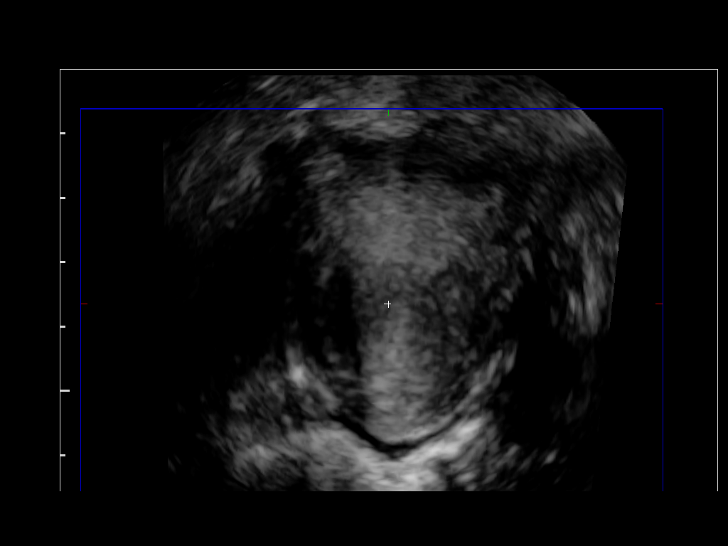
[im 32/39]
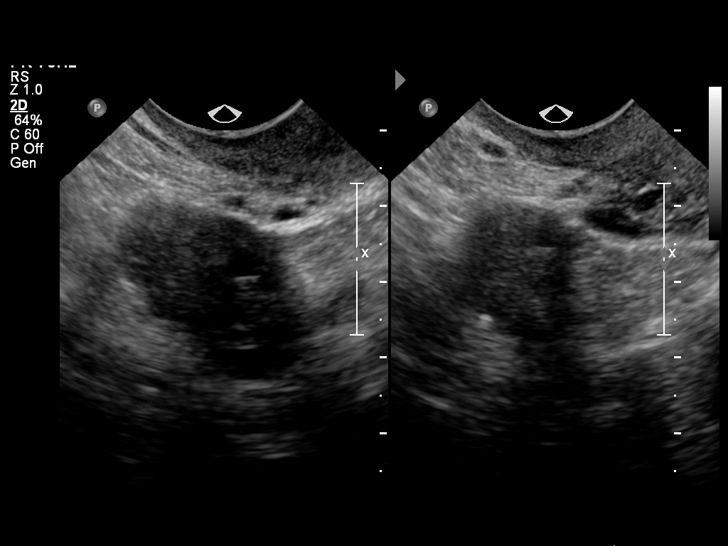
[im 35/39]
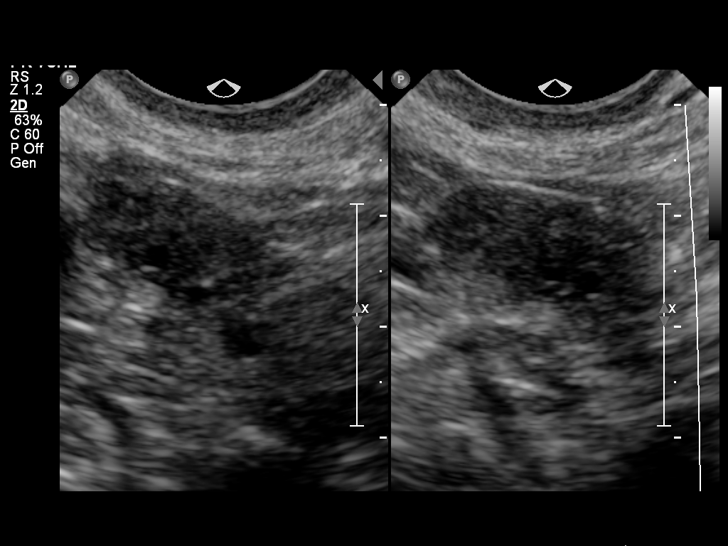
[im 39/39]
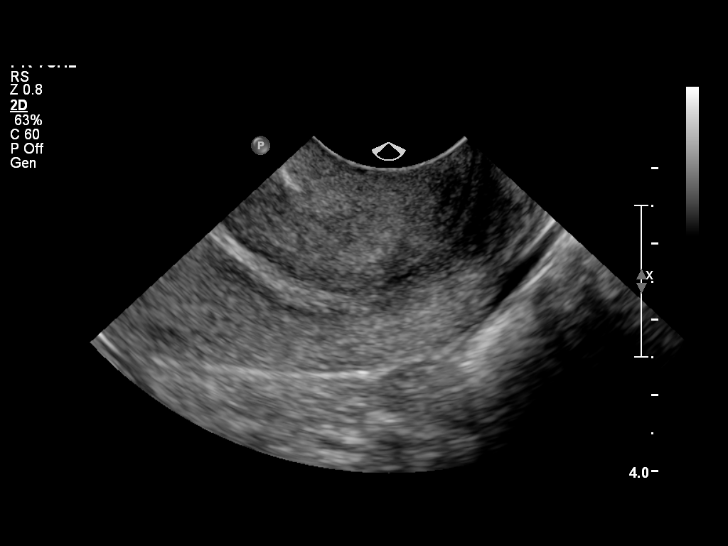

[14 of 25 positions shown; findings below may reference images not displayed]

FINDINGS: Uterus

Measurements: 6.8 x 4.4 x 3.2 cm. No fibroids or other mass
visualized.

Endometrium

Thickness: 9 mm.  No focal abnormality visualized.

Right ovary

Measurements: 3.2 x 1.6 x 1.4 cm. Normal appearance/no adnexal mass.

Left ovary

Measurements: 1.9 x 1.6 x 1.0 cm. Normal appearance/no adnexal mass.

Other findings

No free fluid.
IMPRESSION: Normal exam.
